# Patient Record
Sex: Male | Born: 1975 | Race: White | Hispanic: No | Marital: Married | Smoking: Current every day smoker
Health system: Southern US, Community
[De-identification: ages and names within clinical notes are randomized; demographics above are authoritative.]

---

## 1999-02-16 ENCOUNTER — Observation Stay (HOSPITAL_COMMUNITY): Admission: EM | Admit: 1999-02-16 | Discharge: 1999-02-17 | Payer: Self-pay | Admitting: Emergency Medicine

## 1999-02-16 ENCOUNTER — Encounter: Payer: Self-pay | Admitting: Emergency Medicine

## 2021-03-04 ENCOUNTER — Encounter (HOSPITAL_COMMUNITY): Payer: Self-pay

## 2021-03-04 ENCOUNTER — Inpatient Hospital Stay (HOSPITAL_COMMUNITY)
Admission: EM | Admit: 2021-03-04 | Discharge: 2021-03-08 | DRG: 312 | Disposition: A | Discharge status: Home / Self Care | Payer: Medicaid Other | Attending: Internal Medicine | Admitting: Internal Medicine

## 2021-03-04 DIAGNOSIS — R109 Unspecified abdominal pain: Secondary | ICD-10-CM | POA: Diagnosis present

## 2021-03-04 DIAGNOSIS — Z20822 Contact with and (suspected) exposure to covid-19: Secondary | ICD-10-CM | POA: Diagnosis present

## 2021-03-04 DIAGNOSIS — R059 Cough, unspecified: Secondary | ICD-10-CM | POA: Diagnosis present

## 2021-03-04 DIAGNOSIS — I10 Essential (primary) hypertension: Secondary | ICD-10-CM | POA: Diagnosis present

## 2021-03-04 DIAGNOSIS — R000 Tachycardia, unspecified: Secondary | ICD-10-CM | POA: Diagnosis present

## 2021-03-04 DIAGNOSIS — Z8249 Family history of ischemic heart disease and other diseases of the circulatory system: Secondary | ICD-10-CM

## 2021-03-04 DIAGNOSIS — R079 Chest pain, unspecified: Secondary | ICD-10-CM

## 2021-03-04 DIAGNOSIS — R0602 Shortness of breath: Secondary | ICD-10-CM | POA: Diagnosis present

## 2021-03-04 DIAGNOSIS — M542 Cervicalgia: Secondary | ICD-10-CM | POA: Diagnosis present

## 2021-03-04 DIAGNOSIS — R112 Nausea with vomiting, unspecified: Secondary | ICD-10-CM | POA: Diagnosis present

## 2021-03-04 DIAGNOSIS — F17210 Nicotine dependence, cigarettes, uncomplicated: Secondary | ICD-10-CM | POA: Diagnosis present

## 2021-03-04 DIAGNOSIS — R739 Hyperglycemia, unspecified: Secondary | ICD-10-CM | POA: Diagnosis present

## 2021-03-04 DIAGNOSIS — R0789 Other chest pain: Secondary | ICD-10-CM | POA: Diagnosis present

## 2021-03-04 DIAGNOSIS — R519 Headache, unspecified: Secondary | ICD-10-CM | POA: Diagnosis present

## 2021-03-04 DIAGNOSIS — F419 Anxiety disorder, unspecified: Secondary | ICD-10-CM | POA: Diagnosis present

## 2021-03-04 DIAGNOSIS — R55 Syncope and collapse: Principal | ICD-10-CM | POA: Diagnosis present

## 2021-03-04 LAB — URINALYSIS, ROUTINE W REFLEX MICROSCOPIC
Bilirubin Urine: NEGATIVE
Glucose, UA: NEGATIVE mg/dL
Hgb urine dipstick: NEGATIVE
Ketones, ur: 20 mg/dL — AB
Leukocytes,Ua: NEGATIVE
Nitrite: NEGATIVE
Protein, ur: NEGATIVE mg/dL
Specific Gravity, Urine: 1.027 (ref 1.005–1.030)
pH: 6 (ref 5.0–8.0)

## 2021-03-04 LAB — CBC
HCT: 47.9 % (ref 39.0–52.0)
Hemoglobin: 16.1 g/dL (ref 13.0–17.0)
MCH: 32 pg (ref 26.0–34.0)
MCHC: 33.6 g/dL (ref 30.0–36.0)
MCV: 95.2 fL (ref 80.0–100.0)
Platelets: 294 10*3/uL (ref 150–400)
RBC: 5.03 MIL/uL (ref 4.22–5.81)
RDW: 14.6 % (ref 11.5–15.5)
WBC: 6.9 10*3/uL (ref 4.0–10.5)
nRBC: 0 % (ref 0.0–0.2)

## 2021-03-04 LAB — BASIC METABOLIC PANEL
Anion gap: 10 (ref 5–15)
BUN: 13 mg/dL (ref 6–20)
CO2: 21 mmol/L — ABNORMAL LOW (ref 22–32)
Calcium: 8.6 mg/dL — ABNORMAL LOW (ref 8.9–10.3)
Chloride: 108 mmol/L (ref 98–111)
Creatinine, Ser: 0.8 mg/dL (ref 0.61–1.24)
GFR, Estimated: >60 mL/min (ref >60)
Glucose, Bld: 93 mg/dL (ref 70–99)
Potassium: 3.6 mmol/L (ref 3.5–5.1)
Sodium: 139 mmol/L (ref 135–145)

## 2021-03-04 LAB — LIPASE, BLOOD: Lipase: 35 U/L (ref 11–51)

## 2021-03-04 LAB — TROPONIN I (HIGH SENSITIVITY): Troponin I (High Sensitivity): 4 ng/L (ref <18)

## 2021-03-04 MED ORDER — ONDANSETRON 4 MG PO TBDP
4.0000 mg | ORAL_TABLET | Freq: Once | ORAL | Status: AC | PRN
  Administered 2021-03-04: 4 mg via ORAL
  Filled 2021-03-04: qty 1

## 2021-03-04 NOTE — ED Triage Notes (Signed)
EMS reports pt had an syncope episode  while mowing  the lawn, EMS states been passing out for the past two days and also complaining left  distended flank pain and left neck that has been going on for the past two days with decrease urinary output and had tow vomiting episodes today. EMS states initial hr tachycardia 130 and was given 300 cc of NS   20 Left hand    130/90 HR 110 96 RA  CBG 91

## 2021-03-05 ENCOUNTER — Emergency Department (HOSPITAL_COMMUNITY): Payer: Self-pay

## 2021-03-05 ENCOUNTER — Observation Stay (HOSPITAL_BASED_OUTPATIENT_CLINIC_OR_DEPARTMENT_OTHER): Payer: Medicaid Other

## 2021-03-05 DIAGNOSIS — R55 Syncope and collapse: Principal | ICD-10-CM

## 2021-03-05 DIAGNOSIS — F17210 Nicotine dependence, cigarettes, uncomplicated: Secondary | ICD-10-CM

## 2021-03-05 DIAGNOSIS — R079 Chest pain, unspecified: Secondary | ICD-10-CM | POA: Insufficient documentation

## 2021-03-05 DIAGNOSIS — R0789 Other chest pain: Secondary | ICD-10-CM

## 2021-03-05 LAB — LIPID PANEL
Cholesterol: 158 mg/dL (ref 0–200)
HDL: 65 mg/dL (ref >40)
LDL Cholesterol: 61 mg/dL (ref 0–99)
Total CHOL/HDL Ratio: 2.4 RATIO
Triglycerides: 161 mg/dL — ABNORMAL HIGH (ref <150)
VLDL: 32 mg/dL (ref 0–40)

## 2021-03-05 LAB — SARS CORONAVIRUS 2 (TAT 6-24 HRS): SARS Coronavirus 2: NEGATIVE

## 2021-03-05 LAB — TROPONIN I (HIGH SENSITIVITY): Troponin I (High Sensitivity): 3 ng/L (ref <18)

## 2021-03-05 LAB — HIV ANTIBODY (ROUTINE TESTING W REFLEX): HIV Screen 4th Generation wRfx: NONREACTIVE

## 2021-03-05 LAB — ECHOCARDIOGRAM COMPLETE
AR max vel: 3.8 cm2
AV Area VTI: 3.44 cm2
AV Area mean vel: 3.83 cm2
AV Mean grad: 2 mmHg
AV Peak grad: 4.2 mmHg
Ao pk vel: 1.03 m/s
Area-P 1/2: 3.93 cm2
Height: 68 in
MV VTI: 4.07 cm2
S' Lateral: 2.3 cm
Weight: 2880 oz

## 2021-03-05 LAB — HEMOGLOBIN A1C
Hgb A1c MFr Bld: 5.3 % (ref 4.8–5.6)
Mean Plasma Glucose: 105.41 mg/dL

## 2021-03-05 LAB — TSH: TSH: 1.918 u[IU]/mL (ref 0.350–4.500)

## 2021-03-05 MED ORDER — ASPIRIN 81 MG PO CHEW
81.0000 mg | CHEWABLE_TABLET | Freq: Every day | ORAL | Status: DC
  Administered 2021-03-05 – 2021-03-08 (×4): 81 mg via ORAL
  Filled 2021-03-05 (×4): qty 1

## 2021-03-05 MED ORDER — SODIUM CHLORIDE 0.9% FLUSH
3.0000 mL | Freq: Two times a day (BID) | INTRAVENOUS | Status: DC
  Administered 2021-03-05 – 2021-03-08 (×6): 3 mL via INTRAVENOUS

## 2021-03-05 MED ORDER — ACETAMINOPHEN 650 MG RE SUPP: 650.0000 mg | Freq: Four times a day (QID) | RECTAL | Status: DC | PRN

## 2021-03-05 MED ORDER — ACETAMINOPHEN 325 MG PO TABS
650.0000 mg | ORAL_TABLET | Freq: Four times a day (QID) | ORAL | Status: DC | PRN
  Administered 2021-03-06 – 2021-03-07 (×4): 650 mg via ORAL
  Filled 2021-03-05 (×5): qty 2

## 2021-03-05 MED ORDER — ENOXAPARIN SODIUM 40 MG/0.4ML ~~LOC~~ SOLN
40.0000 mg | SUBCUTANEOUS | Status: DC
  Administered 2021-03-05 – 2021-03-08 (×4): 40 mg via SUBCUTANEOUS
  Filled 2021-03-05 (×4): qty 0.4

## 2021-03-05 NOTE — Hospital Course (Signed)
Admitted 03/04/2021  Allergies: Patient has no known allergies. Pertinent Hx: no known medical conditions  45 y.o. male p/w chest pain, syncope  * Chest pain: Exertional, typical in nature, unclear if nitro helps (didn't receive), associated with n/v, sob, dizziness, and sweating. EKG unremarkable. Trops 4 >5. No CP at this time. Starting ASA. Placed on tele, checking A1c and lipid panel. Echo showed no valvular abnormalities.    *Syncope: One episode had no prodromal symptoms, one was preceded by lightheadedness, dizziness, and shortness of breath.  Echocardiogram showed no valvular abnormalities.  Placed on telemetry and obtaining orthostatics. May need outpatient cardiac monitor.  Consults: None Meds: ASA VTE ppx: Lovenox IVF: None diet: HH

## 2021-03-05 NOTE — ED Notes (Addendum)
Pt outside with family member told pt he need to be in waiting room

## 2021-03-05 NOTE — ED Notes (Signed)
ECHO in progress at this time 

## 2021-03-05 NOTE — ED Notes (Signed)
Pt called out stating his IV was coming out. Assessed the IV and site, IV remains patent and site redressed with new tegaderm and reinforced with tape. IV flushed and saline locked. Pt comfortable and monitoring devices replaced and functioning.

## 2021-03-05 NOTE — ED Notes (Signed)
Patient's wife to bedside. Provided with recliner chair. Patient and wife deny further needs at this time.

## 2021-03-05 NOTE — ED Notes (Signed)
Patient ambulatory to and from restroom with steady gait. Denies dizziness/other issues with ambulation. Patient provided with personal care items to cleanse and is performing self care independently.

## 2021-03-05 NOTE — ED Provider Notes (Signed)
[facility] EMERGENCY DEPARTMENT Provider Note   CSN: [id number] Arrival date & time: 03/04/21  2133     History Chief Complaint  Patient presents with  . Loss of Consciousness  . Flank Pain    Roger Peterson is a 45 y.o. male.  HPI Patient presents with syncopal episodes and chest pain.  Has had some these episodes for a little while but worse over the last few days.  States that he gets some pain in his left lower chest.  States he will cough at times.  Did have some nausea and vomiting.  States however if he tries to exert himself he gets pain in the bottom of his left arm and up his neck.  States his arm gets sweaty and is he will get a little sweaty all over.  Patient does smoke.  Denies drug use.  States his mother had heart arrhythmias that started at a younger age.  States he did have an episode of passing out where he was not exerting himself.  But if he would exert himself he states he would get the pain on the chest arm neck and likely feel dizzy.  States he was pushing mowing the lawn yesterday and passed out after it.  He has had his COVID vaccine.  Patient does not have a doctor that he sees.    History reviewed. No pertinent past medical history.  There are no problems to display for this patient.   History reviewed. No pertinent surgical history.     History reviewed. No pertinent family history.     Home Medications Prior to Admission medications   Not on File    Allergies    Patient has no known allergies.  Review of Systems   Review of Systems  Constitutional: Negative for appetite change.  HENT: Negative for congestion.   Respiratory: Negative for shortness of breath.   Cardiovascular: Positive for chest pain.  Gastrointestinal: Positive for nausea.  Genitourinary: Negative for flank pain.  Musculoskeletal: Negative for back pain.  Skin: Negative for rash.  Neurological: Positive for syncope.  Psychiatric/Behavioral:  Negative for confusion.    Physical Exam Updated Vital Signs BP 136/86 (BP Location: Right Arm)   Pulse 92   Temp 97.9 F (36.6 C)   Resp 17   SpO2 98%   Physical Exam Vitals and nursing note reviewed.  Constitutional:      Comments: Patient has multiple tattoos.  HENT:     Head: Atraumatic.     Right Ear: External ear normal.     Left Ear: External ear normal.  Eyes:     General: No scleral icterus.    Pupils: Pupils are equal, round, and reactive to light.  Cardiovascular:     Rate and Rhythm: Regular rhythm.  Pulmonary:     Breath sounds: No wheezing or rhonchi.  Abdominal:     Tenderness: There is no abdominal tenderness.  Musculoskeletal:        General: No tenderness.     Cervical back: Neck supple.  Skin:    General: Skin is warm.     Capillary Refill: Capillary refill takes less than 2 seconds.  Neurological:     Mental Status: He is alert and oriented to person, place, and time.  Psychiatric:        Mood and Affect: Mood normal.     ED Results / Procedures / Treatments   Labs (all labs ordered are listed, but only abnormal results are  displayed) Labs Reviewed  BASIC METABOLIC PANEL - Abnormal; Notable for the following components:      Result Value   CO2 21 (*)    Calcium 8.6 (*)    All other components within normal limits  URINALYSIS, ROUTINE W REFLEX MICROSCOPIC - Abnormal; Notable for the following components:   Ketones, ur 20 (*)    All other components within normal limits  SARS CORONAVIRUS 2 (TAT 6-24 HRS)  CBC  LIPASE, BLOOD  TROPONIN I (HIGH SENSITIVITY)  TROPONIN I (HIGH SENSITIVITY)    EKG EKG Interpretation  Date/Time:  Monday March 04 2021 21:43:07 EDT Ventricular Rate:  95 PR Interval:  160 QRS Duration: 98 QT Interval:  334 QTC Calculation: 419 R Axis:   71 Text Interpretation: Normal sinus rhythm Normal ECG No old tracing to compare Confirmed by Dione Booze ([id number]) on 03/05/2021 2:36:21 AM Also confirmed by Benjiman Core [phone])  on 03/05/2021 7:55:14 AM   Radiology No results found.  Procedures Procedures   Medications Ordered in ED Medications  ondansetron (ZOFRAN-ODT) disintegrating tablet 4 mg (4 mg Oral Given 03/04/21 2153)    ED Course  I have reviewed the triage vital signs and the nursing notes.  Pertinent labs & imaging results that were available during my care of the patient were reviewed by me and considered in my medical decision making (see chart for details).    MDM Rules/Calculators/A&P                          Patient presents with chest pain/arm pain/neck pain.  Exertional.  States the amount of exertion that brings on the pain is decreased.  EKG reassuring and troponin negative, however with exertion is also had syncope.  Patient does smoke potentially has a cardiac family history.  EKG reassuring.  Blood work reassuring but with worrisome story I feels the patient would benefit from mission to the hospital, particularly since he has no reliable follow-up at this point.  Will discuss with unassigned medicine. Final Clinical Impression(s) / ED Diagnoses Final diagnoses:  Chest pain, unspecified type  Syncope, unspecified syncope type    Rx / DC Orders ED Discharge Orders    None       Benjiman Core, MD 03/05/21 [phone]

## 2021-03-05 NOTE — ED Notes (Signed)
Called lab to have lipid panel added to previous collection

## 2021-03-05 NOTE — ED Notes (Signed)
Echo at bedside

## 2021-03-05 NOTE — H&P (Signed)
Date: 03/05/2021               Patient Name:  Roger Peterson MRN: [id number]  DOB: Dec 05, 1976 Age / Sex: 45 y.o., male   PCP: Pcp, No         Medical Service: Internal Medicine Teaching Service         Attending Physician: Dr. Mayford Knife, Dorene Ar, MD    First Contact: Dr. Austin Miles Pager: [phone]  Second Contact: Dr. Barbaraann Faster Pager: [phone]       After Hours (After 5p/  First Contact Pager: [phone]  weekends / holidays): Second Contact Pager: [phone]   Chief Complaint: Syncopal and exertional chest pain  History of Present Illness: This is a 45 year old male with no known past medical history who is presenting after episodes of syncope and exertional chest pain.  He reports that the pain is located on the left side of his chest, a pressure sensation, it is worse with exertion and radiates up to his left neck and left arm.  Has been going on for the past 2 to 3 weeks, occurring almost daily for the past 3 to 4 days.  Associated with nausea, nonbilious nonbloody emesis, shortness of breath, and sweating.  He has not taken any medications for this.  He denies any current chest pain, however does report a mild pressure sensation.   He also had 2 syncopal episodes.  About 4 days ago he was sitting on a bench in his bathroom when he had a sudden loss of consciousness, lasted for approximately 10 to 15 seconds according to the wife, when he woke up he was confused and did not understand what happened, he denied any prodromal symptoms prior to this episode.  He denied any tongue biting or incontinence. then yesterday he was mowing the lawn, started having sensation dizziness, cold sweats, and went and sat in his car, while he was sitting there his dizziness got worse and he blacked out at that time.  He never had this before.  He does state his mother had a history of arrhythmia and no as some type of blockage.  He also states his father has some type of cardiac issue, is unclear what type.  He  does not have a primary care physician.  In the ED patient was noted to be afebrile, initially tachycardic up to 106, mildly hypertensive at 130/95.  His chest x-ray was negative for any acute abnormalities.  His troponins were 4 and 5.  CBC was within normal limits.  Bicarb is mildly low at 21.  EKG showed normal sinus rhythm, heart rate 95, no acute ST changes.  Patient was admitted to IMTS for further evaluation and management.  Meds:  No outpatient medications have been marked as taking for the 03/04/21 encounter [facility] Encounter).     Allergies: Allergies as of 03/04/2021  . (No Known Allergies)   History reviewed. No pertinent past medical history.  Family History: Mother has a history of an arrhythmia.  Father has some type of cardiac issue.  Social History: He reports smoking approximately 1 pack/day.  Drinks about 1 beer twice a week, last drink was a few days ago.  Denies any recreational drug use.  Works in home improvement.  He has 3 boys.  Currently lives with his uncle and his cousin.  Review of Systems: A complete ROS was negative except as per HPI.   Physical Exam: Blood pressure 120/64, pulse 87, temperature 97.9 F (36.6 C), resp.  rate 13, height 5\' 8"  (1.727 m), weight 81.6 kg, SpO2 95 %. Physical Exam Constitutional:      Appearance: Normal appearance.  HENT:     Head: Normocephalic and atraumatic.     Right Ear: Tympanic membrane normal.     Left Ear: Tympanic membrane normal.     Nose: Nose normal. No congestion.     Mouth/Throat:     Mouth: Mucous membranes are moist.     Pharynx: Oropharynx is clear.  Eyes:     Extraocular Movements: Extraocular movements intact.     Conjunctiva/sclera: Conjunctivae normal.     Pupils: Pupils are equal, round, and reactive to light.  Cardiovascular:     Rate and Rhythm: Normal rate and regular rhythm.     Pulses: Normal pulses.     Heart sounds: Normal heart sounds.  Pulmonary:     Effort: Pulmonary effort is  normal. No respiratory distress.     Breath sounds: Normal breath sounds.  Abdominal:     General: Abdomen is flat. Bowel sounds are normal. There is no distension.     Palpations: Abdomen is soft.     Tenderness: There is no abdominal tenderness.  Musculoskeletal:        General: No swelling. Normal range of motion.     Cervical back: Normal range of motion and neck supple.  Skin:    General: Skin is warm and dry.     Capillary Refill: Capillary refill takes less than 2 seconds.  Neurological:     General: No focal deficit present.     Mental Status: He is alert and oriented to person, place, and time.  Psychiatric:        Mood and Affect: Mood normal.        Behavior: Behavior normal.      EKG: personally reviewed my interpretation is normal sinus rhythm, heart rate 95, left axis deviation, no acute ST changes, no prior to compare  CXR: personally reviewed my interpretation is moderate inspiration, no cardiomegaly, no infiltrates or pleural effusion noted  Assessment & Plan by Problem: Active Problems:   Syncope  Chest pain: Patient reporting episodic chest pain that is exertional in nature, improves with rest, unclear if improves with nitroglycerin.  Appears very typical in nature.  Associated with nausea, emesis, shortness of breath, sweating, and radiates to his left arm and jaw.  He does report smoking, unclear what his other risk factors are.  Does not follow with a PCP.  He was mildly hypertensive on admission.  His EKG shows normal sinus rhythm with no acute ST changes.  His troponins have been flat at 4 and 5.  His CBC and CMP relatively unremarkable.  Patient also reporting episodes of syncope noted below.  Will monitor overnight for recurrence of chest pain.  -Aspirin 81 mg daily -Place on telemetry -Obtaining echocardiogram for syncopal evaluation -Check A1c and lipid panel -If recurrence of chest pain will try nitro, and repeat EKG and troponins -?Cardiology  consult -[facility] consult for PCP needs  Syncope: Patient reporting at least 2 episodes of a syncopal event.  His first episode occurred while he was sitting down, not related to exertion, and had no prodromal symptoms, did have some confusion following the episode.  The second episode occurred after exertion and mowing his lawn, this was associated with cold sweats, dizziness, and lightheadedness. His cardiac and pulmonary exam are unremarkable, with no murmurs, rubs, or gallops noted.  Symptoms do not appear to be consistent  with seizure activity.  Differential includes cardiac arrhythmia, orthostatic hypotension, electrolyte abnormalities. Will place on telemetry and monitor overnight.   -Follow-up echocardiogram -Place on telemetry -Orthostatic vitals -May need cardiology consult for long term monitor -Repeat CBC and BMP in AM  Tobacco use: Smokes 1 pack/day.   -Encourage smoking cessation. -Nicotine patch  Dispo: Admit patient to Observation with expected length of stay less than 2 midnights.  Signed: Claudean Severance, MD 03/05/2021, 11:20 AM  Pager: [phone] After 5pm on weekdays and 1pm on weekends: On Call pager: [phone]

## 2021-03-06 ENCOUNTER — Other Ambulatory Visit: Payer: Self-pay

## 2021-03-06 ENCOUNTER — Encounter (HOSPITAL_COMMUNITY): Payer: Self-pay | Admitting: Internal Medicine

## 2021-03-06 LAB — COMPREHENSIVE METABOLIC PANEL
ALT: 19 U/L (ref 0–44)
AST: 19 U/L (ref 15–41)
Albumin: 3.3 g/dL — ABNORMAL LOW (ref 3.5–5.0)
Alkaline Phosphatase: 49 U/L (ref 38–126)
Anion gap: 7 (ref 5–15)
BUN: 12 mg/dL (ref 6–20)
CO2: 24 mmol/L (ref 22–32)
Calcium: 8.7 mg/dL — ABNORMAL LOW (ref 8.9–10.3)
Chloride: 105 mmol/L (ref 98–111)
Creatinine, Ser: 0.88 mg/dL (ref 0.61–1.24)
GFR, Estimated: >60 mL/min (ref >60)
Glucose, Bld: 116 mg/dL — ABNORMAL HIGH (ref 70–99)
Potassium: 3.7 mmol/L (ref 3.5–5.1)
Sodium: 136 mmol/L (ref 135–145)
Total Bilirubin: 0.9 mg/dL (ref 0.3–1.2)
Total Protein: 6.1 g/dL — ABNORMAL LOW (ref 6.5–8.1)

## 2021-03-06 LAB — CBC
HCT: 45.3 % (ref 39.0–52.0)
Hemoglobin: 15.8 g/dL (ref 13.0–17.0)
MCH: 32.6 pg (ref 26.0–34.0)
MCHC: 34.9 g/dL (ref 30.0–36.0)
MCV: 93.4 fL (ref 80.0–100.0)
Platelets: 257 10*3/uL (ref 150–400)
RBC: 4.85 MIL/uL (ref 4.22–5.81)
RDW: 14.6 % (ref 11.5–15.5)
WBC: 6.5 10*3/uL (ref 4.0–10.5)
nRBC: 0 % (ref 0.0–0.2)

## 2021-03-06 LAB — TROPONIN I (HIGH SENSITIVITY): Troponin I (High Sensitivity): <2 ng/L (ref <18)

## 2021-03-06 LAB — GLUCOSE, CAPILLARY: Glucose-Capillary: 104 mg/dL — ABNORMAL HIGH (ref 70–99)

## 2021-03-06 MED ORDER — NICOTINE 21 MG/24HR TD PT24: 21.0000 mg | MEDICATED_PATCH | Freq: Every day | TRANSDERMAL | Status: DC

## 2021-03-06 MED ORDER — NITROGLYCERIN 0.4 MG SL SUBL
0.4000 mg | SUBLINGUAL_TABLET | Freq: Once | SUBLINGUAL | Status: AC | PRN
  Administered 2021-03-06: 0.4 mg via SUBLINGUAL
  Filled 2021-03-06: qty 1

## 2021-03-06 MED ORDER — NITROGLYCERIN 0.4 MG SL SUBL: 0.4000 mg | SUBLINGUAL_TABLET | SUBLINGUAL | Status: DC | PRN

## 2021-03-06 MED ORDER — LIDOCAINE 5 % EX PTCH
1.0000 | MEDICATED_PATCH | Freq: Every day | CUTANEOUS | Status: DC | PRN
  Administered 2021-03-06: 1 via TRANSDERMAL
  Filled 2021-03-06 (×2): qty 1

## 2021-03-06 MED ORDER — NICOTINE 21 MG/24HR TD PT24
21.0000 mg | MEDICATED_PATCH | Freq: Every day | TRANSDERMAL | Status: DC
  Administered 2021-03-06 – 2021-03-08 (×3): 21 mg via TRANSDERMAL
  Filled 2021-03-06 (×3): qty 1

## 2021-03-06 NOTE — ED Notes (Signed)
Attempted report x1. 

## 2021-03-06 NOTE — Progress Notes (Signed)
OT Cancellation Note  Patient Details Name: Roger Peterson MRN: [id number] DOB: 10/30/1976   Cancelled Treatment:    Reason Eval/Treat Not Completed: Discussed with PT. OT screened, no needs identified, will sign off  [facility]  Luisa Dago, OT/L   Acute OT Clinical Specialist Acute Rehabilitation Services Pager [phone] Office [phone]  03/06/2021, 10:08 AM

## 2021-03-06 NOTE — Plan of Care (Signed)

## 2021-03-06 NOTE — Progress Notes (Addendum)
   Subjective:   Had an episode of nausea last night for which he received zofran, now resolved. No further chest pain in the past day.  He does complain of neck pain. Upon further questioning, this is a new symptoms since Sunday which began suddenly. It has improved since then but not resolved. This preceded his recent episode of syncope. It is throbbing in nature, associated with photophobia, nausea, and dizziness. He locates it to his L posterior neck and occipital region. No history of similar headaches, but does note a musculoskeletal injury in the past and states this feels like whiplash. Patient and his wife agree that he has been more forgetful lately and seem to think more sluggishly. He answers questions appropriately but takes a mildly longer than what would be expected. Able to count down from 20 by 2s with some difficulty, unable to count down from 100 by 7s at all. Pain is worse with forward flexion and mildly worse with rotation especially to the left, not bothered by flexion to the left or right.   Objective:  Vital signs in last 24 hours: Vitals:   03/06/21 1230 03/06/21 1840 03/06/21 2341 03/07/21 0515  BP: 121/77 131/86 135/80 129/82  Pulse: 84 81 84 86  Resp: 16 16 18 16   Temp: 98.4 F (36.9 C) 98.4 F (36.9 C) 97.7 F (36.5 C) 97.9 F (36.6 C)  TempSrc: Oral Oral Oral Oral  SpO2: 96% 97% 95% 98%  Weight:    85.5 kg  Height:       Physical Exam Vitals and nursing note reviewed.  Constitutional: no acute distress Head: atraumatic, minimal tenderness to posterior low occipital/high cervical region,  ENT: external ears normal Eyes: EOMI, PERRL Pulmonary: effort normal Abdominal: flat Musculoskeletal: no muscle spasm over L or R paracervical area, no trapezius tenderness Skin: warm and dry Neurological: alert, no focal deficit Psychiatric: normal mood and affect   Assessment/Plan: Mr. Fieldhouse is a 45 y/o M with no known medical history presenting with chest  pain and syncope, negative cardiac workup thus far.   Active Problems:   Syncope   Syncope with headache Prodromal with chest pain and also headache during 1 of 2 syncopal episodes, but also has chest pain without syncope. Normal orthostatics upon admission, telemetry unrevealing thus far, echo without valvular or structural abnormalities. With sudden onset severe headache that has improved but not resolved since then, concern for intracranial bleed or other neurologic etiology. --cardiology following. Stress test as above. Possibly event monitor on outpatient follow up --f/u non-contrast head CT  Chest Pain Cardiac workup thus far has been negative, low suspicion for ACS. EKG unremarkable, troponins flat, ECHO negative for valvular/structural abnormalities. HbA1c, TSH, lipid panel all within normal limits. --Cardiology consulted, appreciate recommendations. plan for nuclear stress this morning.  -continue ASA 81mg  daily -TOC consult for PCP needs -- scheduled for visit at [facility] on May 5th -encourage smoking cessation   Prior to Admission Living Arrangement: Home Anticipated Discharge Location: TBD Barriers to Discharge: continued medical management Dispo: Anticipated discharge in approximately 0-1 day(s).   [facility], MD 03/07/2021, 6:39 AM Pager: [phone] After 5pm on weekdays and 1pm on weekends: On Call pager [phone]

## 2021-03-06 NOTE — Consult Note (Signed)
Reason for Consult: Recurrent chest pain Referring Physician: [facility]  Roger EaringRandy D Aime is an 45 y.o. male.  HPI: Patient is 45 year old male with no significant past medical history except for tobacco abuse was admitted yesterday because of recurrent left-sided chest pain described as pressure radiating to left arm lasting few minutes associated with syncopal episode.  Chest pain is nonexertional and also increases with deep breathing.  States breath exercises regularly without any chest pain.  Denies any weakness in the arms or legs denies any slurred speech denies any seizure activity.  EKG done in the ED showed no acute ischemic changes 2 sets of high-sensitivity troponin high has been negative.  Patient denies any shortness of breath.  Denies palpitation lightheadedness prior to syncopal episode.  Patient denies any drug abuse.  History reviewed. No pertinent past medical history.  History reviewed. No pertinent surgical history.  History reviewed. No pertinent family history.  Social History:  reports that he has been smoking cigarettes. He has quit using smokeless tobacco. No history on file for alcohol use and drug use.  Allergies: No Known Allergies  Medications: I have reviewed the patient's current medications.  Results for orders placed or performed during the hospital encounter of 03/04/21 (from the past 48 hour(s))  Urinalysis, Routine w reflex microscopic Urine, Clean Catch     Status: Abnormal   Collection Time: 03/04/21  9:48 PM  Result Value Ref Range   Color, Urine YELLOW YELLOW   APPearance CLEAR CLEAR   Specific Gravity, Urine 1.027 1.005 - 1.030   pH 6.0 5.0 - 8.0   Glucose, UA NEGATIVE NEGATIVE mg/dL   Hgb urine dipstick NEGATIVE NEGATIVE   Bilirubin Urine NEGATIVE NEGATIVE   Ketones, ur 20 (A) NEGATIVE mg/dL   Protein, ur NEGATIVE NEGATIVE mg/dL   Nitrite NEGATIVE NEGATIVE   Leukocytes,Ua NEGATIVE NEGATIVE    Comment: Performed at [facility], [address] [place], [place] [address]  Basic metabolic panel     Status: Abnormal   Collection Time: 03/04/21  9:55 PM  Result Value Ref Range   Sodium 139 135 - 145 mmol/L   Potassium 3.6 3.5 - 5.1 mmol/L   Chloride 108 98 - 111 mmol/L   CO2 21 (L) 22 - 32 mmol/L   Glucose, Bld 93 70 - 99 mg/dL    Comment: Glucose reference range applies only to samples taken after fasting for at least 8 hours.   BUN 13 6 - 20 mg/dL   Creatinine, Ser 6.040.80 0.61 - 1.24 mg/dL   Calcium 8.6 (L) 8.9 - 10.3 mg/dL   GFR, Estimated >54>60 >09>60 mL/min    Comment: (NOTE) Calculated using the CKD-EPI Creatinine Equation (2021)    Anion gap 10 5 - 15    Comment: Performed at [facility], [address] [place], [place] [address]  CBC     Status: None   Collection Time: 03/04/21  9:55 PM  Result Value Ref Range   WBC 6.9 4.0 - 10.5 K/uL   RBC 5.03 4.22 - 5.81 MIL/uL   Hemoglobin 16.1 13.0 - 17.0 g/dL   HCT 47.847.9 29.539.0 - 62.152.0 %   MCV 95.2 80.0 - 100.0 fL   MCH 32.0 26.0 - 34.0 pg   MCHC 33.6 30.0 - 36.0 g/dL   RDW 30.814.6 65.711.5 - 84.615.5 %   Platelets 294 150 - 400 K/uL   nRBC 0.0 0.0 - 0.2 %    Comment: Performed at [facility], [address], [place], [place] [address]  Lipase, blood     Status: None   Collection Time: 03/04/21  9:55 PM  Result Value Ref Range   Lipase 35 11 - 51 U/L    Comment: Performed at [facility], [address] [place], [place] [address]  Troponin I (High Sensitivity)     Status: None   Collection Time: 03/04/21  9:55 PM  Result Value Ref Range   Troponin I (High Sensitivity) 4 <18 ng/L    Comment: (NOTE) Elevated high sensitivity troponin I (hsTnI) values and significant  changes across serial measurements may suggest ACS but many other  chronic and acute conditions are known to elevate hsTnI results.  Refer to the "Links" section for chest pain algorithms and additional  guidance. Performed at [facility], [address]  [place], [place] [address]   Troponin I (High Sensitivity)     Status: None   Collection Time: 03/05/21 12:08 AM  Result Value Ref Range   Troponin I (High Sensitivity) 3 <18 ng/L    Comment: (NOTE) Elevated high sensitivity troponin I (hsTnI) values and significant  changes across serial measurements may suggest ACS but many other  chronic and acute conditions are known to elevate hsTnI results.  Refer to the "Links" section for chest pain algorithms and additional  guidance. Performed at [facility], [address] [place], [place] [address]   Lipid panel     Status: Abnormal   Collection Time: 03/05/21 12:08 AM  Result Value Ref Range   Cholesterol 158 0 - 200 mg/dL   Triglycerides 253 (H) <150 mg/dL   HDL 65 >66 mg/dL   Total CHOL/HDL Ratio 2.4 RATIO   VLDL 32 0 - 40 mg/dL   LDL Cholesterol 61 0 - 99 mg/dL    Comment:        Total Cholesterol/HDL:CHD Risk Coronary Heart Disease Risk Table                     Men   Women  1/2 Average Risk   3.4   3.3  Average Risk       5.0   4.4  2 X Average Risk   9.6   7.1  3 X Average Risk  23.4   11.0        Use the calculated Patient Ratio above and the CHD Risk Table to determine the patient's CHD Risk.        ATP III CLASSIFICATION (LDL):  <100     mg/dL   Optimal  440-347  mg/dL   Near or Above                    Optimal  130-159  mg/dL   Borderline  425-956  mg/dL   High  >387     mg/dL   Very High Performed at [facility], [address], [place], [place] [address]   SARS CORONAVIRUS 2 (TAT 6-24 HRS) Nasopharyngeal Nasopharyngeal Swab     Status: None   Collection Time: 03/05/21  7:48 AM   Specimen: Nasopharyngeal Swab  Result Value Ref Range   SARS Coronavirus 2 NEGATIVE NEGATIVE    Comment: (NOTE) SARS-CoV-2 target nucleic acids are NOT DETECTED.  The SARS-CoV-2 RNA is generally detectable in upper and lower respiratory specimens during the acute phase of infection. Negative results do not preclude  SARS-CoV-2 infection, do not rule  out co-infections with other pathogens, and should not be used as the sole basis for treatment or other patient management decisions. Negative results must be combined with clinical observations, patient history, and epidemiological information. The expected result is Negative.  Fact Sheet for Patients: HairSlick.no  Fact Sheet for Healthcare Providers: quierodirigir.com  This test is not yet approved or cleared by the [place] FDA and  has been authorized for detection and/or diagnosis of SARS-CoV-2 by FDA under an Emergency Use Authorization (EUA). This EUA will remain  in effect (meaning this test can be used) for the duration of the COVID-19 declaration under Se ction 564(b)(1) of the Act, 21 U.S.C. section 360bbb-3(b)(1), unless the authorization is terminated or revoked sooner.  Performed at [facility], [address], [place], [place] [address]   HIV Antibody (routine testing w rflx)     Status: None   Collection Time: 03/05/21  9:45 AM  Result Value Ref Range   HIV Screen 4th Generation wRfx Non Reactive Non Reactive    Comment: Performed at [facility], [address], [place], [place] [address]  TSH     Status: None   Collection Time: 03/05/21  9:45 AM  Result Value Ref Range   TSH 1.918 0.350 - 4.500 uIU/mL    Comment: Performed by a 3rd Generation assay with a functional sensitivity of <=0.01 uIU/mL. Performed at [facility], [address] [place], [place] [address]   Hemoglobin A1c     Status: None   Collection Time: 03/05/21 12:30 PM  Result Value Ref Range   Hgb A1c MFr Bld 5.3 4.8 - 5.6 %    Comment: (NOTE) Pre diabetes:          5.7%-6.4%  Diabetes:              >6.4%  Glycemic control for   <7.0% adults with diabetes    Mean Plasma Glucose 105.41 mg/dL    Comment: Performed at [facility], [address] [place], [place]  [address]  Comprehensive metabolic panel     Status: Abnormal   Collection Time: 03/06/21  3:04 AM  Result Value Ref Range   Sodium 136 135 - 145 mmol/L   Potassium 3.7 3.5 - 5.1 mmol/L   Chloride 105 98 - 111 mmol/L   CO2 24 22 - 32 mmol/L   Glucose, Bld 116 (H) 70 - 99 mg/dL    Comment: Glucose reference range applies only to samples taken after fasting for at least 8 hours.   BUN 12 6 - 20 mg/dL   Creatinine, Ser 0.72 0.61 - 1.24 mg/dL   Calcium 8.7 (L) 8.9 - 10.3 mg/dL   Total Protein 6.1 (L) 6.5 - 8.1 g/dL   Albumin 3.3 (L) 3.5 - 5.0 g/dL   AST 19 15 - 41 U/L   ALT 19 0 - 44 U/L   Alkaline Phosphatase 49 38 - 126 U/L   Total Bilirubin 0.9 0.3 - 1.2 mg/dL   GFR, Estimated >25 >75 mL/min    Comment: (NOTE) Calculated using the CKD-EPI Creatinine Equation (2021)    Anion gap 7 5 - 15    Comment: Performed at [facility], [address] [place], [place] [address]  CBC     Status: None   Collection Time: 03/06/21  3:04 AM  Result Value Ref Range   WBC 6.5 4.0 - 10.5 K/uL   RBC 4.85 4.22 - 5.81 MIL/uL  Hemoglobin 15.8 13.0 - 17.0 g/dL   HCT 82.9 56.2 - 13.0 %   MCV 93.4 80.0 - 100.0 fL   MCH 32.6 26.0 - 34.0 pg   MCHC 34.9 30.0 - 36.0 g/dL   RDW 86.5 78.4 - 69.6 %   Platelets 257 150 - 400 K/uL   nRBC 0.0 0.0 - 0.2 %    Comment: Performed at [facility], [address], [place], [place] [address]  Troponin I (High Sensitivity)     Status: None   Collection Time: 03/06/21  3:04 AM  Result Value Ref Range   Troponin I (High Sensitivity) <2 <18 ng/L    Comment: (NOTE) Elevated high sensitivity troponin I (hsTnI) values and significant  changes across serial measurements may suggest ACS but many other  chronic and acute conditions are known to elevate hsTnI results.  Refer to the "Links" section for chest pain algorithms and additional  guidance. Performed at [facility], [address] [place], [place] [address]   Glucose, capillary     Status:  Abnormal   Collection Time: 03/06/21  6:06 AM  Result Value Ref Range   Glucose-Capillary 104 (H) 70 - 99 mg/dL    Comment: Glucose reference range applies only to samples taken after fasting for at least 8 hours.    DG Chest Portable 1 View  Result Date: 03/05/2021 CLINICAL DATA:  Chest pain EXAM: PORTABLE CHEST 1 VIEW COMPARISON:  None. FINDINGS: The heart size and mediastinal contours are within normal limits. Both lungs are clear. The visualized skeletal structures are unremarkable. IMPRESSION: No active disease. Electronically Signed   By: Alcide Clever M.D.   On: 03/05/2021 08:05   ECHOCARDIOGRAM COMPLETE  Result Date: 03/05/2021    ECHOCARDIOGRAM REPORT   Patient Name:   Roger Peterson Date of Exam: 03/05/2021 Medical Rec #:  [id number]        Height:       68.0 in Accession #:    [id number]       Weight:       180.0 lb Date of Birth:  1976-01-21        BSA:          1.954 m Patient Age:    44 years         BP:           120/64 mmHg Patient Gender: M                HR:           85 bpm. Exam Location:  Inpatient Procedure: 2D Echo, 3D Echo, Cardiac Doppler, Color Doppler and Strain Analysis Indications:    Syncope  History:        Patient has prior history of Echocardiogram examinations and                 Patient has no prior history of Echocardiogram examinations.                 Signs/Symptoms:Syncope.  Sonographer:    Neomia Dear RDCS Referring Phys: [id number] NATHAN PICKERING IMPRESSIONS  1. Left ventricular ejection fraction, by estimation, is 55 to 60%. The left ventricle has normal function. The left ventricle has no regional wall motion abnormalities. Left ventricular diastolic parameters were normal.  2. Right ventricular systolic function is normal. The right ventricular size is normal. There is normal pulmonary artery systolic pressure. The estimated right ventricular systolic pressure is 15.4 mmHg.  3.  The mitral valve is grossly normal. No evidence of mitral valve regurgitation. No  evidence of mitral stenosis.  4. The aortic valve is tricuspid. Aortic valve regurgitation is not visualized. No aortic stenosis is present.  5. The inferior vena cava is normal in size with greater than 50% respiratory variability, suggesting right atrial pressure of 3 mmHg. Conclusion(s)/Recommendation(s): Normal biventricular function without evidence of hemodynamically significant valvular heart disease. FINDINGS  Left Ventricle: Left ventricular ejection fraction, by estimation, is 55 to 60%. The left ventricle has normal function. The left ventricle has no regional wall motion abnormalities. Global longitudinal strain performed but not reported based on interpreter judgement due to suboptimal tracking. The left ventricular internal cavity size was normal in size. There is no left ventricular hypertrophy. Left ventricular diastolic parameters were normal. Right Ventricle: The right ventricular size is normal. No increase in right ventricular wall thickness. Right ventricular systolic function is normal. There is normal pulmonary artery systolic pressure. The tricuspid regurgitant velocity is 1.76 m/s, and  with an assumed right atrial pressure of 3 mmHg, the estimated right ventricular systolic pressure is 15.4 mmHg. Left Atrium: Left atrial size was normal in size. Right Atrium: Right atrial size was normal in size. Pericardium: Trivial pericardial effusion is present. Mitral Valve: The mitral valve is grossly normal. No evidence of mitral valve regurgitation. No evidence of mitral valve stenosis. MV peak gradient, 2.0 mmHg. The mean mitral valve gradient is 1.0 mmHg. Tricuspid Valve: The tricuspid valve is grossly normal. Tricuspid valve regurgitation is not demonstrated. No evidence of tricuspid stenosis. Aortic Valve: The aortic valve is tricuspid. Aortic valve regurgitation is not visualized. No aortic stenosis is present. Aortic valve mean gradient measures 2.0 mmHg. Aortic valve peak gradient measures  4.2 mmHg. Aortic valve area, by VTI measures 3.44 cm. Pulmonic Valve: The pulmonic valve was grossly normal. Pulmonic valve regurgitation is not visualized. No evidence of pulmonic stenosis. Aorta: The aortic root and ascending aorta are structurally normal, with no evidence of dilitation. Venous: The inferior vena cava is normal in size with greater than 50% respiratory variability, suggesting right atrial pressure of 3 mmHg. IAS/Shunts: The atrial septum is grossly normal.  LEFT VENTRICLE PLAX 2D LVIDd:         4.20 cm  Diastology LVIDs:         2.30 cm  LV e' medial:    9.25 cm/s LV PW:         1.20 cm  LV E/e' medial:  8.0 LV IVS:        1.30 cm  LV e' lateral:   12.90 cm/s LVOT diam:     2.50 cm  LV E/e' lateral: 5.7 LV SV:         74 LV SV Index:   38 LVOT Area:     4.91 cm  RIGHT VENTRICLE RV S prime:     11.20 cm/s  PULMONARY VEINS TAPSE (M-mode): 2.2 cm      A Reversal Duration: 71.00 msec                             Diastolic Velocity:  29.20 cm/s                             S/D Velocity:        1.30  Systolic Velocity:   37.30 cm/s LEFT ATRIUM           Index       RIGHT ATRIUM           Index LA diam:      3.10 cm 1.59 cm/m  RA Area:     12.40 cm LA Vol (A4C): 40.0 ml 20.47 ml/m RA Volume:   27.80 ml  14.23 ml/m  AORTIC VALVE                   PULMONIC VALVE AV Area (Vmax):    3.80 cm    PV Vmax:       0.91 m/s AV Area (Vmean):   3.83 cm    PV Vmean:      64.600 cm/s AV Area (VTI):     3.44 cm    PV VTI:        0.174 m AV Vmax:           103.00 cm/s PV Peak grad:  3.3 mmHg AV Vmean:          75.700 cm/s PV Mean grad:  2.0 mmHg AV VTI:            0.214 m AV Peak Grad:      4.2 mmHg AV Mean Grad:      2.0 mmHg LVOT Vmax:         79.80 cm/s LVOT Vmean:        59.100 cm/s LVOT VTI:          0.150 m LVOT/AV VTI ratio: 0.70  AORTA Ao Root diam: 3.50 cm Ao Asc diam:  2.90 cm MITRAL VALVE               TRICUSPID VALVE MV Area (PHT): 3.93 cm    TR Peak grad:   12.4 mmHg MV  Area VTI:   4.07 cm    TR Vmax:        176.00 cm/s MV Peak grad:  2.0 mmHg MV Mean grad:  1.0 mmHg    SHUNTS MV Vmax:       0.71 m/s    Systemic VTI:  0.15 m MV Vmean:      52.0 cm/s   Systemic Diam: 2.50 cm MV Decel Time: 193 msec MV E velocity: 73.70 cm/s MV A velocity: 62.60 cm/s MV E/A ratio:  1.18 Lennie Odor MD Electronically signed by Lennie Odor MD Signature Date/Time: 03/05/2021/11:37:42 AM    Final     Review of Systems  Constitutional: Negative for diaphoresis, fatigue and fever.  HENT: Negative for sore throat.   Eyes: Negative for discharge.  Respiratory: Negative for shortness of breath and wheezing.   Cardiovascular: Positive for chest pain. Negative for palpitations and leg swelling.  Gastrointestinal: Positive for abdominal distention. Negative for abdominal pain.  Genitourinary: Negative for difficulty urinating.  Neurological: Negative for dizziness.   Blood pressure 125/84, pulse 89, temperature 98.3 F (36.8 C), temperature source Oral, resp. rate 19, height  (1.727 m), weight 85.5 kg, SpO2 96 %. Physical Exam Constitutional:      Appearance: Normal appearance.  HENT:     Head: Normocephalic and atraumatic.  Eyes:     Extraocular Movements: Extraocular movements intact.     Pupils: Pupils are equal, round, and reactive to light.  Neck:     Vascular: No carotid bruit.  Cardiovascular:     Rate and Rhythm: Normal rate and regular rhythm.  Heart sounds: No murmur heard. No gallop.   Pulmonary:     Effort: Pulmonary effort is normal.     Breath sounds: Normal breath sounds.  Abdominal:     General: Abdomen is flat. There is no distension.     Palpations: Abdomen is soft.     Tenderness: There is no abdominal tenderness.  Musculoskeletal:        General: No swelling or tenderness.     Cervical back: Normal range of motion and neck supple.     Right lower leg: No edema.     Left lower leg: No edema.  Skin:    General: Skin is warm and dry.   Neurological:     General: No focal deficit present.     Mental Status: He is alert and oriented to person, place, and time.     Assessment/Plan: Recurrent chest pain MI ruled out with some features worrisome for angina Status post syncope rule out cardiac arrhythmia Tobacco abuse Elevated blood sugar Plan Continue present management We will schedule him for nuclear stress test in a.m. May need event monitor as outpatient continues to have recurrent syncopal episode Check lipid panel Encourage patient to stop smoking and lifestyle changes and compliance with diet and restricting carbohydrates  Rinaldo Cloud 03/06/2021, 12:10 PM

## 2021-03-06 NOTE — TOC Initial Note (Addendum)
Transition of Care [facility]) - Initial/Assessment Note    Patient Details  Name: Roger Peterson MRN: [id number] Date of Birth: Jul 27, 1976  [facility] CM/SW Contact:    Kingsley Plan, RN Phone Number: 03/06/2021, 1:17 PM  Clinical Narrative:                  Spoke to patient at bedside. Confirmed face sheet information.  Patient does not have a PCP. Discussed [facility]. He prefers [facility], scheduled appointment for Apr 18, 2021 at 1010 placed on AVS     .Expected Discharge Plan: Home/Self Care Barriers to Discharge: Continued Medical Work up   Patient Goals and CMS Choice Patient states their goals for this hospitalization and ongoing recovery are:: to return to home CMS Medicare.gov Compare Post Acute Care list provided to:: Patient Choice offered to / list presented to : Patient  Expected Discharge Plan and Services Expected Discharge Plan: Home/Self Care   Discharge Planning Services: CM Consult,[facility] Program,Medication Assistance   Living arrangements for the past 2 months: Single Family Home                 DME Arranged: N/A DME Agency: NA       HH Arranged: NA          Prior Living Arrangements/Services Living arrangements for the past 2 months: Single Family Home Lives with:: Spouse Patient language and need for interpreter reviewed:: Yes Do you feel safe going back to the place where you live?: Yes      Need for Family Participation in Patient Care: Yes (Comment) Care giver support system in place?: Yes (comment)   Criminal Activity/Legal Involvement Pertinent to Current Situation/Hospitalization: No - Comment as needed  Activities of Daily Living Home Assistive Devices/Equipment: None ADL Screening (condition at time of admission) Patient's cognitive ability adequate to safely complete daily activities?: Yes Is the patient deaf or have difficulty hearing?: No Does the patient have  difficulty seeing, even when wearing glasses/contacts?: No Does the patient have difficulty concentrating, remembering, or making decisions?: Yes Patient able to express need for assistance with ADLs?: Yes Does the patient have difficulty dressing or bathing?: No Independently performs ADLs?: Yes (appropriate for developmental age) Does the patient have difficulty walking or climbing stairs?: No Weakness of Legs: None Weakness of Arms/Hands: Left  Permission Sought/Granted   Permission granted to share information with : No              Emotional Assessment Appearance:: Appears stated age Attitude/Demeanor/Rapport: Engaged Affect (typically observed): Accepting Orientation: : Oriented to Self,Oriented to Place,Oriented to  Time,Oriented to Situation Alcohol / Substance Use: Not Applicable Psych Involvement: No (comment)  Admission diagnosis:  Syncope [R55] Syncope, unspecified syncope type [R55] Chest pain, unspecified type [R07.9] Patient Active Problem List   Diagnosis Date Noted  . Syncope 03/05/2021  . Chest pain    PCP:  Pcp, No Pharmacy:   [id number] [place], [place] - [address] AT [facility] OF [address] & [facility] [address] [place] [address] Phone: [phone] Fax: [phone]  [facility] [place], [place] - [address] [place] [address] Phone: [phone] Fax: [phone]     Social Determinants of Health (SDOH) Interventions    Readmission Risk Interventions No flowsheet data found.

## 2021-03-06 NOTE — Evaluation (Signed)
Physical Therapy Evaluation Patient Details Name: Roger Peterson MRN: [id number] DOB: August 23, 1976 Today's Date: 03/06/2021   History of Present Illness  Roger Peterson is a 45 y/o male who reported to ED following L neck pain and a syncopal episode after mowing the lawn. Pt reports multiple episodes of syncope and was admitted 03/04/21.    Clinical Impression  Pt received in roo,m walking from restroom. Denied any chest pain this morning, but did report some abdominal pain. Pt moving well with no assist needed for mobility. No LOB. No increase in chest pain with mobility. VSS on RA. Pt's mobility is at his baseline. Pt left sitting at EOB with all needs met, call bell within reach, and family member present. At this time, do not anticipate that pt needs further PT follow up. Will sign off for now. Thank you for the chance to participate in this pt's care.     Follow Up Recommendations No PT follow up    Equipment Recommendations  None recommended by PT    Recommendations for Other Services       Precautions / Restrictions Precautions Precautions: None Restrictions Weight Bearing Restrictions: No      Mobility  Bed Mobility Overal bed mobility: Independent                  Transfers Overall transfer level: Independent                  Ambulation/Gait Ambulation/Gait assistance: Min guard Gait Distance (Feet): 200 Feet Assistive device: None Gait Pattern/deviations: WFL(Within Functional Limits);Step-to pattern     General Gait Details: min guard for safety, no physical assist given  Stairs Stairs: Yes Stairs assistance: Min guard Stair Management: One rail Left;Alternating pattern;Forwards Number of Stairs: 6 General stair comments: min guard for safety, reported minimal lightheadedness following stairs but decreased afterwards, VSS  Wheelchair Mobility    Modified Rankin (Stroke Patients Only)       Balance Overall balance assessment: Independent                                            Pertinent Vitals/Pain Pain Assessment: 0-10 Pain Score: 6  Pain Location: Abdominal pain Pain Descriptors / Indicators: Shooting;Sharp Pain Intervention(s): Monitored during session    Home Living Family/patient expects to be discharged to:: Private residence Living Arrangements: Other relatives (uncle and cousin) Available Help at Discharge: Family;Available PRN/intermittently Type of Home: House Home Access: Stairs to enter Entrance Stairs-Rails: Left Entrance Stairs-Number of Steps: 6 Home Layout: One level Home Equipment: Cane - single point      Prior Function Level of Independence: Independent               Hand Dominance        Extremity/Trunk Assessment   Upper Extremity Assessment Upper Extremity Assessment: Overall WFL for tasks assessed    Lower Extremity Assessment Lower Extremity Assessment: Overall WFL for tasks assessed    Cervical / Trunk Assessment Cervical / Trunk Assessment: Normal  Communication   Communication: No difficulties  Cognition Arousal/Alertness: Awake/alert Behavior During Therapy: WFL for tasks assessed/performed Overall Cognitive Status: Within Functional Limits for tasks assessed  General Comments General comments (skin integrity, edema, etc.): VSS on RA    Exercises     Assessment/Plan    PT Assessment Patent does not need any further PT services  PT Problem List         PT Treatment Interventions      PT Goals (Current goals can be found in the Care Plan section)  Acute Rehab PT Goals Patient Stated Goal: feel better PT Goal Formulation: With patient Time For Goal Achievement: 03/19/21 Potential to Achieve Goals: Good    Frequency     Barriers to discharge        Co-evaluation               AM-PAC PT "6 Clicks" Mobility  Outcome Measure Help needed turning from your back to your  side while in a flat bed without using bedrails?: None Help needed moving from lying on your back to sitting on the side of a flat bed without using bedrails?: None Help needed moving to and from a bed to a chair (including a wheelchair)?: None Help needed standing up from a chair using your arms (e.g., wheelchair or bedside chair)?: None Help needed to walk in hospital room?: None Help needed climbing 3-5 steps with a railing? : None 6 Click Score: 24    End of Session Equipment Utilized During Treatment: Gait belt Activity Tolerance: Patient tolerated treatment well Patient left: in bed;with call bell/phone within reach;with family/visitor present Nurse Communication: Mobility status PT Visit Diagnosis: History of falling (Z91.81)    Time:  -      Charges:            Rosita Kea, SPT

## 2021-03-06 NOTE — Progress Notes (Signed)
Paged by RN for chest pain while patient transferring to the floor. Patient states he is having left sided chest pain/pressure radiating down left arm. States he has a strange pressure like sensation in left arm. States feels similar to prior episodes of pain that brought him to the ED. States it started about half an hour ago in the ED when he was woken up to be transferred. States he become somewhat upset that wife was unable to accompany him to the floor and subsequently started having pain. Denies shortness of breath, diaphoresis, nausea, vomiting, dizziness, lightheadedness, syncope, or near syncope.   Blood pressure (!) 126/97, pulse 82, temperature 98 F (36.7 C), temperature source Oral, resp. rate 18, height 5\' 8"  (1.727 m), weight 86.5 kg, SpO2 95 %.  Consitutional: laying in bed, appears comfortable, no acute distress Pulmonary: Normal effort, normal breath sounds Cardiovascular: Normal rate and regular rhythm,  Skin: warm dry Neurological: alert and oriented x4, no weakness, no cranial nerve deficits, subjective diminished sensation in the left arm  Plan Appears to have similar episode of chest pain although appears to be lasting longer than usual. Possible that his chest pain has been exacerbated by anxiety, although he states he is feeling calmer now. Will repeat EKG and troponin to rule out ACS. Will order a dose of nitro to see if this improves his pain.

## 2021-03-06 NOTE — Progress Notes (Signed)
Pt arrived to room 5C21 in NAD,VS stable. Pt reports 7 out of 10 left sided CP that radiates and causes numbness in left arm. Pt states this pain is similar to the pain he came in with. Patient oriented to room and call bell in reach. Patient advised of visitation policy. Provider paged. See orders.

## 2021-03-06 NOTE — Progress Notes (Signed)
IM on call notified that patient is a Qday smoker and need a nicotine patch. Gay Filler

## 2021-03-07 ENCOUNTER — Inpatient Hospital Stay (HOSPITAL_COMMUNITY): Payer: Self-pay

## 2021-03-07 LAB — GLUCOSE, CAPILLARY: Glucose-Capillary: 95 mg/dL (ref 70–99)

## 2021-03-07 MED ORDER — REGADENOSON 0.4 MG/5ML IV SOLN
INTRAVENOUS | Status: AC
  Filled 2021-03-07: qty 5

## 2021-03-07 MED ORDER — TECHNETIUM TC 99M TETROFOSMIN IV KIT
32.3000 | PACK | Freq: Once | INTRAVENOUS | Status: AC | PRN
  Administered 2021-03-07: 32.3 via INTRAVENOUS

## 2021-03-07 MED ORDER — REGADENOSON 0.4 MG/5ML IV SOLN
0.4000 mg | Freq: Once | INTRAVENOUS | Status: AC
  Administered 2021-03-07: 0.4 mg via INTRAVENOUS

## 2021-03-07 MED ORDER — TECHNETIUM TC 99M TETROFOSMIN IV KIT
10.1000 | PACK | Freq: Once | INTRAVENOUS | Status: AC | PRN
  Administered 2021-03-07: 10.1 via INTRAVENOUS

## 2021-03-07 MED ORDER — ONDANSETRON 4 MG PO TBDP
4.0000 mg | ORAL_TABLET | Freq: Once | ORAL | Status: AC | PRN
  Administered 2021-03-07: 4 mg via ORAL
  Filled 2021-03-07: qty 1

## 2021-03-07 NOTE — Progress Notes (Signed)
Patient returned from stress test, VSS. MD at bedside. MD ok for patient to shower at this time. No other distress noted or concerns voiced.

## 2021-03-07 NOTE — Progress Notes (Signed)
Subjective:  Doing well denies any chest pain or shortness of breath tolerated stress portion of the nuclear test.  No acute EKG changes noted Myoview scan is pending  Objective:  Vital Signs in the last 24 hours: Temp:  [97.7 F (36.5 C)-98.4 F (36.9 C)] 97.9 F (36.6 C) (03/24 0515) Pulse Rate:  [81-114] 109 (03/24 1034) Resp:  [16-18] 16 (03/24 0515) BP: (113-135)/(77-90) 113/77 (03/24 1034) SpO2:  [95 %-98 %] 98 % (03/24 0515) Weight:  [85.5 kg] 85.5 kg (03/24 0515)  Intake/Output from previous day: 03/23 0701 - 03/24 0700 In: -  Out: 200 [Urine:200] Intake/Output from this shift: No intake/output data recorded.  Physical Exam: Exam unchanged  Lab Results: Recent Labs    03/04/21 2155 03/06/21 0304  WBC 6.9 6.5  HGB 16.1 15.8  PLT 294 257   Recent Labs    03/04/21 2155 03/06/21 0304  NA 139 136  K 3.6 3.7  CL 108 105  CO2 21* 24  GLUCOSE 93 116*  BUN 13 12  CREATININE 0.80 0.88   No results for input(s): TROPONINI in the last 72 hours.  Invalid input(s): CK, MB Hepatic Function Panel Recent Labs    03/06/21 0304  PROT 6.1*  ALBUMIN 3.3*  AST 19  ALT 19  ALKPHOS 49  BILITOT 0.9   Recent Labs    03/05/21 0008  CHOL 158   No results for input(s): PROTIME in the last 72 hours.  Imaging: Imaging results have been reviewed and No results found.  Cardiac Studies:  Assessment/plan recurrent chest pain MI ruled out with some features worrisome for angina Status post syncope rule out cardiac arrhythmia Tobacco abuse Elevated blood sugar Plan Continue present management Check nuclear stress test results Okay to discharge from cardiac point of view if no evidence of reversible ischemia Follow-up with me in 2 weeks  LOS: 1 day    Rinaldo Cloud 03/07/2021, 10:37 AM

## 2021-03-07 NOTE — Progress Notes (Signed)
Patient having nausea MD on call paged will continue to monitor. Awaiting new orders Ilean Skill LPN

## 2021-03-08 ENCOUNTER — Inpatient Hospital Stay (HOSPITAL_COMMUNITY): Payer: Self-pay

## 2021-03-08 ENCOUNTER — Inpatient Hospital Stay: Payer: Medicaid Other | Admitting: Nurse Practitioner

## 2021-03-08 LAB — GLUCOSE, CAPILLARY: Glucose-Capillary: 89 mg/dL (ref 70–99)

## 2021-03-08 MED ORDER — GADOBUTROL 1 MMOL/ML IV SOLN
8.0000 mL | Freq: Once | INTRAVENOUS | Status: AC | PRN
  Administered 2021-03-08: 8 mL via INTRAVENOUS

## 2021-03-08 NOTE — Progress Notes (Signed)
Subjective:  Patient just left for MRI of the brain.  Per wife no further chest pain he is doing well nuclear stress test showed no evidence of reversible ischemia or infarction although mildly depressed LV systolic function  Objective:  Vital Signs in the last 24 hours: Temp:  [97.6 F (36.4 C)-98.2 F (36.8 C)] 97.6 F (36.4 C) (03/25 0604) Pulse Rate:  [85-96] 85 (03/25 0604) Resp:  [18] 18 (03/25 0604) BP: (116-124)/(80-99) 116/80 (03/25 0604) SpO2:  [94 %-95 %] 95 % (03/25 0604) Weight:  [84.8 kg] 84.8 kg (03/25 0604)  Intake/Output from previous day: 03/24 0701 - 03/25 0700 In: 1080 [P.O.:1080] Out: -  Intake/Output from this shift: No intake/output data recorded.  Physical Exam: Unchanged  Lab Results: Recent Labs    03/06/21 0304  WBC 6.5  HGB 15.8  PLT 257   Recent Labs    03/06/21 0304  NA 136  K 3.7  CL 105  CO2 24  GLUCOSE 116*  BUN 12  CREATININE 0.88   No results for input(s): TROPONINI in the last 72 hours.  Invalid input(s): CK, MB Hepatic Function Panel Recent Labs    03/06/21 0304  PROT 6.1*  ALBUMIN 3.3*  AST 19  ALT 19  ALKPHOS 49  BILITOT 0.9   No results for input(s): CHOL in the last 72 hours. No results for input(s): PROTIME in the last 72 hours.  Imaging: Imaging results have been reviewed and CT HEAD WO CONTRAST  Result Date: 03/07/2021 CLINICAL DATA:  Headache, new or worsening, cancer history, LOC. EXAM: CT HEAD WITHOUT CONTRAST TECHNIQUE: Contiguous axial images were obtained from the base of the skull through the vertex without intravenous contrast. COMPARISON:  No pertinent prior exams available for comparison. FINDINGS: Brain: Cerebral volume is normal. Minimal patchy hypoattenuation within the cerebral white matter, nonspecific but most often secondary to chronic small vessel ischemia. There is no acute intracranial hemorrhage. No demarcated cortical infarct. No extra-axial fluid collection. No evidence of intracranial  mass. No midline shift. Vascular: No hyperdense vessel. Skull: Normal. Negative for fracture or focal lesion. Sinuses/Orbits: Visualized orbits show no acute finding. Trace bilateral ethmoid sinus mucosal thickening. Mild mucosal thickening within the left sphenoid sinus. IMPRESSION: No evidence of acute intracranial abnormality. Minimal patchy hypoattenuation within the cerebral white matter, nonspecific but most often secondary to chronic small vessel ischemia. Mild bilateral ethmoid and left sphenoid sinus mucosal thickening. Electronically Signed   By: Jackey Loge DO   On: 03/07/2021 15:54   NM Myocar Multi W/Spect W/Wall Motion / EF  Result Date: 03/07/2021 CLINICAL DATA:  Chest pain. EXAM: MYOCARDIAL IMAGING WITH SPECT (REST AND PHARMACOLOGIC-STRESS) GATED LEFT VENTRICULAR WALL MOTION STUDY LEFT VENTRICULAR EJECTION FRACTION TECHNIQUE: Standard myocardial SPECT imaging was performed after resting intravenous injection of 10 mCi Tc-79m tetrofosmin. Subsequently, intravenous infusion of Lexiscan was performed under the supervision of the Cardiology staff. At peak effect of the drug, 32.3 mCi Tc-38m tetrofosmin was injected intravenously and standard myocardial SPECT imaging was performed. Quantitative gated imaging was also performed to evaluate left ventricular wall motion, and estimate left ventricular ejection fraction. COMPARISON:  None. FINDINGS: Perfusion: No decreased activity in the left ventricle on stress imaging to suggest reversible ischemia or infarction. Wall Motion: Normal left ventricular wall motion. No left ventricular dilation. Left Ventricular Ejection Fraction: 46 % End diastolic volume 114 ml End systolic volume 62 ml IMPRESSION: 1. No reversible ischemia or infarction. 2. Normal left ventricular wall motion. 3. Left ventricular ejection fraction 46%  4. Non invasive risk stratification*: Intermediate *2012 Appropriate Use Criteria for Coronary Revascularization Focused Update: J Am  Coll Cardiol. 2012;59(9):857-881. http://[email or web address]?articleid=[email or web address] Electronically Signed   By: Rudie Meyer M.D.   On: 03/07/2021 14:31    Cardiac Studies:  Assessment/Plan:  Status post noncardiac chest pain negative nuclear stress test Status post syncope work-up in progress Tobacco abuse Elevated blood sugar Plan Okay to discharge from cardiac point of view We will arrange for event monitor as appropriate as outpatient  Follow-up with me in 2 weeks   LOS: 2 days    Rinaldo Cloud 03/08/2021, 12:15 PM

## 2021-03-08 NOTE — Discharge Summary (Signed)
Name: Roger Peterson MRN: [id number] DOB: 03/29/1976 45 y.o. PCP: Pcp, No  Date of Admission: 03/04/2021  9:36 PM Date of Discharge:  03/08/2021 Attending Physician: Miguel Aschoff, MD  Discharge Diagnosis: 1. Typical Chest Pain  2. Syncope  Discharge Medications: Allergies as of 03/08/2021   No Known Allergies     Medication List    You have not been prescribed any medications.     Disposition and follow-up:   [name] was discharged from [facility] in Stable condition.  At the hospital follow up visit please address:  1. Typical Chest Pain. Negative cardiac workup (EKG, trops, ECHO, and nuclear stress test). F/u with Dr. Sharyn Lull in 2 weeks for long-term monitor. F/u with [facility] on Apr 18, 2021 to establish PCP. Encourage smoking cessation.  2. Syncope. Negative workup (ECHO, orthostatics, telemetry, stress test).   2.  Labs / imaging needed at time of follow-up: CMP  3.  Pending labs/ test needing follow-up: None  Follow-up Appointments:  Follow-up Information    [facility] Follow up.   Why: Apr 18, 2021  at 10:10 am  Contact information: [address] [place] [address] [phone]       Rinaldo Cloud, MD Follow up in 2 week(s).   Specialty: Cardiology Contact information: [address] [place] [address] [phone]               Hospital Course by problem list: 1. Typical Chest Pain. Patient presented with episodic chest pain that was exertional in nature, present at rest and with exertion. Cardiac workup grossly negative with normal EKG, flat troponins, ECHO negative for valvular/structural abnormalities. HbA1c and TSH wnl. Cardiology consulted, planned for nuclear stress test which was negative. Scheduled f/u visit with Dr. Sharyn Lull, cardiology, in 2 weeks for possible long term monitor placement. F/u at [facility] on Apr 18, 2021 to establish new PCP.  Needs extensive counseling on smoking cessation.  2. Syncope with headache. Prodromal symptoms with chest pain and also had headache during 1 of 2 syncopal episodes, but also had chest pain without syncope. Normal orthostatic vitals upon admission, telemetry was unrevealing, ECHO without valvular/structural abnormalities, and negative nuclear stress test. Had a sudden onset severe headache that came on yesterday, description similar to "thunderclap" headache. Evaluated with CT head without contrast and MRI brain, both of which were unremarkable. Plan for discharge today with outpatient f/u with Dr. Sharyn Lull in 2 weeks for long term monitor placement. F/u at [facility] on Apr 18, 2021 to establish new PCP.   Discharge Exam:   BP 116/80 (BP Location: Left Arm)   Pulse 85   Temp 97.6 F (36.4 C) (Oral)   Resp 18   Ht 5\' 8"  (1.727 m)   Wt 84.8 kg   SpO2 95%   BMI 28.43 kg/m  Discharge exam:  General: middle aged male, sitting up in bed, NAD. HENT: NCAT, minimal tenderness in posterior neck, mucous membranes pink and moist. Eyes: EOMI CV: normal rate and regular rhythm. Pulmonary: respiratory effort normal, no respiratory distress. Skin: warm and dry Neuro: AAOx3, no focal deficits. Able to perform serial 2 subtractions and serial 7 subtractions. Psych: normal mood and appropriate affect.  Pertinent Labs, Studies, and Procedures:  CBC Latest Ref Rng & Units 03/06/2021 03/04/2021  WBC 4.0 - 10.5 K/uL 6.5 6.9  Hemoglobin 13.0 - 17.0 g/dL 03/06/2021 16.1  Hematocrit 39.0 - 52.0 % 45.3 47.9  Platelets 150 - 400 K/uL 257 294   CMP Latest Ref Rng & Units 03/06/2021 03/04/2021  Glucose 70 - 99 mg/dL 161(W116(H) 93  BUN 6 - 20 mg/dL 12 13  Creatinine 9.600.61 - 1.24 mg/dL 4.540.88 0.980.80  Sodium 119135 - 145 mmol/L 136 139  Potassium 3.5 - 5.1 mmol/L 3.7 3.6  Chloride 98 - 111 mmol/L 105 108  CO2 22 - 32 mmol/L 24 21(L)  Calcium 8.9 - 10.3  mg/dL 1.4(N8.7(L) 8.2(N8.6(L)  Total Protein 6.5 - 8.1 g/dL 6.1(L) -  Total Bilirubin 0.3 - 1.2 mg/dL 0.9 -  Alkaline Phos 38 - 126 U/L 49 -  AST 15 - 41 U/L 19 -  ALT 0 - 44 U/L 19 -   Urinalysis    Component Value Date/Time   COLORURINE YELLOW 03/04/2021 2148   APPEARANCEUR CLEAR 03/04/2021 2148   LABSPEC 1.027 03/04/2021 2148   PHURINE 6.0 03/04/2021 2148   GLUCOSEU NEGATIVE 03/04/2021 2148   HGBUR NEGATIVE 03/04/2021 2148   BILIRUBINUR NEGATIVE 03/04/2021 2148   KETONESUR 20 (A) 03/04/2021 2148   PROTEINUR NEGATIVE 03/04/2021 2148   NITRITE NEGATIVE 03/04/2021 2148   LEUKOCYTESUR NEGATIVE 03/04/2021 2148    Lipase     Component Value Date/Time   LIPASE 35 03/04/2021 2155   Troponins  4  >>  3  >>  <2  TSH 1.918  HbA1c 5.3   DG Chest Portable 1 View  Result Date: 03/05/2021 CLINICAL DATA:  Chest pain EXAM: PORTABLE CHEST 1 VIEW COMPARISON:  None. FINDINGS: The heart size and mediastinal contours are within normal limits. Both lungs are clear. The visualized skeletal structures are unremarkable. IMPRESSION: No active disease. Electronically Signed   By: Alcide CleverMark  Lukens M.D.   On: 03/05/2021 08:05   ECHOCARDIOGRAM COMPLETE  Result Date: 03/05/2021    ECHOCARDIOGRAM REPORT   Patient Name:   Roger Peterson Date of Exam: 03/05/2021 Medical Rec #:  [id number]        Height:       68.0 in Accession #:    [id number]       Weight:       180.0 lb Date of Birth:  01/08/1976        BSA:          1.954 m Patient Age:    44 years         BP:           120/64 mmHg Patient Gender: M                HR:           85 bpm. Exam Location:  Inpatient Procedure: 2D Echo, 3D Echo, Cardiac Doppler, Color Doppler and Strain Analysis Indications:    Syncope  History:        Patient has prior history of Echocardiogram examinations and                 Patient has no prior history of Echocardiogram examinations.                 Signs/Symptoms:Syncope.  Sonographer:    Neomia DearAMARA CROWN RDCS Referring Phys: [id number] NATHAN  PICKERING IMPRESSIONS  1. Left ventricular ejection fraction, by estimation, is 55 to 60%. The left ventricle has normal function. The left ventricle has no regional wall motion abnormalities. Left ventricular diastolic parameters were normal.  2. Right ventricular systolic function is normal. The right ventricular size is normal.  There is normal pulmonary artery systolic pressure. The estimated right ventricular systolic pressure is 15.4 mmHg.  3. The mitral valve is grossly normal. No evidence of mitral valve regurgitation. No evidence of mitral stenosis.  4. The aortic valve is tricuspid. Aortic valve regurgitation is not visualized. No aortic stenosis is present.  5. The inferior vena cava is normal in size with greater than 50% respiratory variability, suggesting right atrial pressure of 3 mmHg. Conclusion(s)/Recommendation(s): Normal biventricular function without evidence of hemodynamically significant valvular heart disease. FINDINGS  Left Ventricle: Left ventricular ejection fraction, by estimation, is 55 to 60%. The left ventricle has normal function. The left ventricle has no regional wall motion abnormalities. Global longitudinal strain performed but not reported based on interpreter judgement due to suboptimal tracking. The left ventricular internal cavity size was normal in size. There is no left ventricular hypertrophy. Left ventricular diastolic parameters were normal. Right Ventricle: The right ventricular size is normal. No increase in right ventricular wall thickness. Right ventricular systolic function is normal. There is normal pulmonary artery systolic pressure. The tricuspid regurgitant velocity is 1.76 m/s, and  with an assumed right atrial pressure of 3 mmHg, the estimated right ventricular systolic pressure is 15.4 mmHg. Left Atrium: Left atrial size was normal in size. Right Atrium: Right atrial size was normal in size. Pericardium: Trivial pericardial effusion is present. Mitral Valve:  The mitral valve is grossly normal. No evidence of mitral valve regurgitation. No evidence of mitral valve stenosis. MV peak gradient, 2.0 mmHg. The mean mitral valve gradient is 1.0 mmHg. Tricuspid Valve: The tricuspid valve is grossly normal. Tricuspid valve regurgitation is not demonstrated. No evidence of tricuspid stenosis. Aortic Valve: The aortic valve is tricuspid. Aortic valve regurgitation is not visualized. No aortic stenosis is present. Aortic valve mean gradient measures 2.0 mmHg. Aortic valve peak gradient measures 4.2 mmHg. Aortic valve area, by VTI measures 3.44 cm. Pulmonic Valve: The pulmonic valve was grossly normal. Pulmonic valve regurgitation is not visualized. No evidence of pulmonic stenosis. Aorta: The aortic root and ascending aorta are structurally normal, with no evidence of dilitation. Venous: The inferior vena cava is normal in size with greater than 50% respiratory variability, suggesting right atrial pressure of 3 mmHg. IAS/Shunts: The atrial septum is grossly normal.  LEFT VENTRICLE PLAX 2D LVIDd:         4.20 cm  Diastology LVIDs:         2.30 cm  LV e' medial:    9.25 cm/s LV PW:         1.20 cm  LV E/e' medial:  8.0 LV IVS:        1.30 cm  LV e' lateral:   12.90 cm/s LVOT diam:     2.50 cm  LV E/e' lateral: 5.7 LV SV:         74 LV SV Index:   38 LVOT Area:     4.91 cm  RIGHT VENTRICLE RV S prime:     11.20 cm/s  PULMONARY VEINS TAPSE (M-mode): 2.2 cm      A Reversal Duration: 71.00 msec                             Diastolic Velocity:  29.20 cm/s                             S/D Velocity:  1.30                             Systolic Velocity:   37.30 cm/s LEFT ATRIUM           Index       RIGHT ATRIUM           Index LA diam:      3.10 cm 1.59 cm/m  RA Area:     12.40 cm LA Vol (A4C): 40.0 ml 20.47 ml/m RA Volume:   27.80 ml  14.23 ml/m  AORTIC VALVE                   PULMONIC VALVE AV Area (Vmax):    3.80 cm    PV Vmax:       0.91 m/s AV Area (Vmean):   3.83 cm    PV  Vmean:      64.600 cm/s AV Area (VTI):     3.44 cm    PV VTI:        0.174 m AV Vmax:           103.00 cm/s PV Peak grad:  3.3 mmHg AV Vmean:          75.700 cm/s PV Mean grad:  2.0 mmHg AV VTI:            0.214 m AV Peak Grad:      4.2 mmHg AV Mean Grad:      2.0 mmHg LVOT Vmax:         79.80 cm/s LVOT Vmean:        59.100 cm/s LVOT VTI:          0.150 m LVOT/AV VTI ratio: 0.70  AORTA Ao Root diam: 3.50 cm Ao Asc diam:  2.90 cm MITRAL VALVE               TRICUSPID VALVE MV Area (PHT): 3.93 cm    TR Peak grad:   12.4 mmHg MV Area VTI:   4.07 cm    TR Vmax:        176.00 cm/s MV Peak grad:  2.0 mmHg MV Mean grad:  1.0 mmHg    SHUNTS MV Vmax:       0.71 m/s    Systemic VTI:  0.15 m MV Vmean:      52.0 cm/s   Systemic Diam: 2.50 cm MV Decel Time: 193 msec MV E velocity: 73.70 cm/s MV A velocity: 62.60 cm/s MV E/A ratio:  1.18 Lennie Odor MD Electronically signed by Lennie Odor MD Signature Date/Time: 03/05/2021/11:37:42 AM    Final     Discharge Instructions: Discharge Instructions    Call MD for:  difficulty breathing, headache or visual disturbances   Complete by: As directed    Call MD for:  extreme fatigue   Complete by: As directed    Call MD for:  hives   Complete by: As directed    Call MD for:  persistant dizziness or light-headedness   Complete by: As directed    Call MD for:  persistant nausea and vomiting   Complete by: As directed    Call MD for:  redness, tenderness, or signs of infection (pain, swelling, redness, odor or green/yellow discharge around incision site)   Complete by: As directed    Call MD for:  severe uncontrolled pain   Complete by: As directed    Call MD for:  temperature >  100.4   Complete by: As directed    Diet - low sodium heart healthy   Complete by: As directed    Discharge instructions   Complete by: As directed    Mr Boylen, it was a pleasure taking care of you during your stay here. You came in with chest pain and loss of consciousness, but were  found to not have anything wrong.  Please take note of the following:  1. Please see Dr. Sharyn Lull (Heart doctor) in 2 weeks for your long term monitor.  2. Please go to [facility] on Apr 18, 2021 to visit your new primary care doctor.  3. If you have another bad headache like the one in the hospital, please go to the emergency department.   Increase activity slowly   Complete by: As directed       Signed: Merrilyn Puma, MD 03/08/2021, 4:30 PM   Pager: [phone]

## 2021-03-27 ENCOUNTER — Ambulatory Visit (INDEPENDENT_AMBULATORY_CARE_PROVIDER_SITE_OTHER): Payer: No Payment, Other | Admitting: Clinical

## 2021-03-27 ENCOUNTER — Other Ambulatory Visit: Payer: Self-pay

## 2021-03-27 DIAGNOSIS — F411 Generalized anxiety disorder: Secondary | ICD-10-CM | POA: Diagnosis not present

## 2021-03-27 DIAGNOSIS — F4310 Post-traumatic stress disorder, unspecified: Secondary | ICD-10-CM

## 2021-03-30 DIAGNOSIS — F4310 Post-traumatic stress disorder, unspecified: Secondary | ICD-10-CM | POA: Insufficient documentation

## 2021-03-30 DIAGNOSIS — F411 Generalized anxiety disorder: Secondary | ICD-10-CM | POA: Insufficient documentation
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
## 2021-04-18 ENCOUNTER — Inpatient Hospital Stay: Payer: Medicaid Other | Admitting: Physician Assistant

## 2021-05-14 ENCOUNTER — Ambulatory Visit (HOSPITAL_COMMUNITY): Payer: No Payment, Other | Admitting: Physician Assistant

## 2021-05-22 ENCOUNTER — Ambulatory Visit (HOSPITAL_COMMUNITY): Payer: No Payment, Other | Admitting: Physician Assistant

## 2021-05-22 ENCOUNTER — Other Ambulatory Visit: Payer: Self-pay

## 2021-05-22 ENCOUNTER — Ambulatory Visit (INDEPENDENT_AMBULATORY_CARE_PROVIDER_SITE_OTHER): Payer: No Payment, Other | Admitting: Physician Assistant

## 2021-05-22 ENCOUNTER — Encounter (HOSPITAL_COMMUNITY): Payer: Self-pay | Admitting: Physician Assistant

## 2021-05-22 DIAGNOSIS — F411 Generalized anxiety disorder: Secondary | ICD-10-CM

## 2021-05-22 DIAGNOSIS — F4310 Post-traumatic stress disorder, unspecified: Secondary | ICD-10-CM

## 2021-05-22 DIAGNOSIS — F3112 Bipolar disorder, current episode manic without psychotic features, moderate: Secondary | ICD-10-CM | POA: Diagnosis not present

## 2021-05-22 MED ORDER — HYDROXYZINE HCL 10 MG PO TABS: 10.0000 mg | ORAL_TABLET | Freq: Three times a day (TID) | ORAL | 1 refills | Status: DC | PRN

## 2021-05-22 MED ORDER — QUETIAPINE FUMARATE 50 MG PO TABS
50.0000 mg | ORAL_TABLET | Freq: Every day | ORAL | 1 refills | Status: DC
Start: 2021-05-22 — End: 2021-07-19

## 2021-05-22 NOTE — Progress Notes (Signed)
Psychiatric Initial Adult Assessment   Patient Identification: Roger Peterson MRN:  [id number] Date of Evaluation:  05/22/2021 Referral Source: N/A Chief Complaint:  "Real bad anxiety." Visit Diagnosis:    ICD-10-CM   1. Generalized anxiety disorder  F41.1 hydrOXYzine (ATARAX/VISTARIL) 10 MG tablet  2. Posttraumatic stress disorder  F43.10 QUEtiapine (SEROQUEL) 50 MG tablet  3. Bipolar affective disorder, currently manic, moderate (HCC)  F31.12 QUEtiapine (SEROQUEL) 50 MG tablet    History of Present Illness:    Roger Peterson is a 45 year old male with no documented past psychiatric history who presents to [facility] for "real bad anxiety."  Patient further elaborates, "whenever I work, I get ill."   Patient endorses being unable to concentrate while enduring racing thoughts.  Patient reports that his symptoms have been worsening ever since being released from prison on December 26th, 2021.  Patient states that he was incarcerated for 20 years due to a sex-related crime.  Patient states that he was placed on medications while in prison but is unsure of the name.  He reports that he eventually discontinued taking the medication after experiencing fatigue while taking it.  Patient endorses anxiety he rates a 10 out of 10.  Triggers to the patient's anxiety include interacting with people and large crowds.  Patient's anxiety is sometimes alleviated via walking or sticking to himself.  He reports that his anxiety is often accompanied by shaking.  In addition to anxiety, patient reports that he is often prone to mood swings with his emotions often progressing to anger then to sadness.  Patient endorses daily outbursts without warning and states that his body feels like it is going 100 miles per hours.  Patient reports depression but feels that his mood is either up or down constantly.  Patient endorses mood swings, irritability, feeling like he can go  multiple days without sleep, hyperactivity, and lack of concentration.  Patient denies a history of being seen by a psychiatrist and does not recall any other time he has been placed on psychiatric medications except for when he was in prison.  Patient denies a past history of suicide attempt and denies self-injurious behavior.  A PHQ-9 screen was performed with the patient scoring a 13.  A GAD-7 screen was also performed with the patient scoring a 20.  A [place] Suicide Severity Rating Scale was performed with the patient being considered no risk.  Patient is visibly restless on exam but is cooperative and fully engaged in conversation during the encounter.  Patient denies suicidal or homicidal ideations and denies a past history of suicide attempt.  He further denies auditory or visual hallucinations and does not appear to be responding to internal/external stimuli.  Patient endorses poor sleep and receives on average 4 hours of intermittent sleep.  Patient endorses poor appetite and states that he does not eat too much.  Patient denies alcohol consumption, tobacco use, and illicit drug use.  Patient states that he has used marijuana in the past.  Associated Signs/Symptoms: Depression Symptoms:  depressed mood, anhedonia, psychomotor agitation, psychomotor retardation, fatigue, difficulty concentrating, impaired memory, anxiety, panic attacks, loss of energy/fatigue, disturbed sleep, decreased appetite, (Hypo) Manic Symptoms:  Distractibility, Elevated Mood, Flight of Ideas, Grandiosity, Impulsivity, Irritable Mood, Labiality of Mood, Anxiety Symptoms:  Agoraphobia, Excessive Worry, Panic Symptoms, Obsessive Compulsive Symptoms:   Checking, Patient states that he has a constant need to fix things, Social Anxiety, Psychotic Symptoms:  Paranoia, PTSD Symptoms: Had a traumatic exposure:  Patient states that being locked up in prison was traumatic for him Had a traumatic exposure in  the last month:  N/A Re-experiencing:  Flashbacks Intrusive Thoughts Nightmares Hypervigilance:  Yes Hyperarousal:  Difficulty Concentrating Emotional Numbness/Detachment Increased Startle Response Irritability/Anger Sleep Avoidance:  Decreased Interest/Participation Foreshortened Future  Past Psychiatric History:  No documented psychiatric history  Previous Psychotropic Medications: Yes   Substance Abuse History in the last 12 months:  Yes.    Consequences of Substance Abuse: Medical Consequences:  None Legal Consequences:  None Family Consequences:  None Blackouts:  N/A DT's: N/A Withdrawal Symptoms:   None  Past Medical History: No past medical history on file. No past surgical history on file.  Family Psychiatric History:  Patient reports that his mother takes Xanax but is unsure of her diagnosis  Family History: No family history on file.  Social History:   Social History   Socioeconomic History  . Marital status: Married    Spouse name: Not on file  . Number of children: Not on file  . Years of education: Not on file  . Highest education level: Not on file  Occupational History  . Not on file  Tobacco Use  . Smoking status: Current Every Day Smoker    Types: Cigarettes  . Smokeless tobacco: Former Engineer, water and Sexual Activity  . Alcohol use: Not on file  . Drug use: Not on file  . Sexual activity: Not on file  Other Topics Concern  . Not on file  Social History Narrative  . Not on file   Social Determinants of Health   Financial Resource Strain: Not on file  Food Insecurity: Not on file  Transportation Needs: Not on file  Physical Activity: Not on file  Stress: Not on file  Social Connections: Not on file    Additional Social History:  Patient has a past history of incarceration ans was in jail for roughly 20 years. Patient states that he recently got out on December 26th, 2022. Patient is currently employed and works in  Scientist, physiological.    Allergies:  No Known Allergies  Metabolic Disorder Labs: Lab Results  Component Value Date   HGBA1C 5.3 03/05/2021   MPG 105.41 03/05/2021   No results found for: PROLACTIN Lab Results  Component Value Date   CHOL 158 03/05/2021   TRIG 161 (H) 03/05/2021   HDL 65 03/05/2021   CHOLHDL 2.4 03/05/2021   VLDL 32 03/05/2021   LDLCALC 61 03/05/2021   Lab Results  Component Value Date   TSH 1.918 03/05/2021    Therapeutic Level Labs: No results found for: LITHIUM No results found for: CBMZ No results found for: VALPROATE  Current Medications: Current Outpatient Medications  Medication Sig Dispense Refill  . hydrOXYzine (ATARAX/VISTARIL) 10 MG tablet Take 1 tablet (10 mg total) by mouth 3 (three) times daily as needed for anxiety. 75 tablet 1  . QUEtiapine (SEROQUEL) 50 MG tablet Take 1 tablet (50 mg total) by mouth at bedtime. 30 tablet 1   No current facility-administered medications for this visit.    Musculoskeletal: Strength & Muscle Tone: within normal limits Gait & Station: normal Patient leans: N/A  Psychiatric Specialty Exam: Review of Systems  Psychiatric/Behavioral: Positive for agitation, decreased concentration and sleep disturbance. Negative for dysphoric mood, hallucinations, self-injury and suicidal ideas. The patient is nervous/anxious and is hyperactive.     There were no vitals taken for this visit.There is no height or weight on file to calculate BMI.  General  Appearance: Fairly Groomed  Eye Contact:  Good  Speech:  Clear and Coherent and Normal Rate  Volume:  Normal  Mood:  Anxious, Depressed and Irritable  Affect:  Congruent and Depressed  Thought Process:  Coherent, Goal Directed and Descriptions of Associations: Intact  Orientation:  Full (Time, Place, and Person)  Thought Content:  WDL  Suicidal Thoughts:  No  Homicidal Thoughts:  No  Memory:  Immediate;   Good Recent;   Good Remote;   Good  Judgement:  Good   Insight:  Good  Psychomotor Activity:  Restlessness  Concentration:  Concentration: Good and Attention Span: Good  Recall:  Good  Fund of Knowledge:Fair  Language: Good  Akathisia:  Negative  Handed:  Right  AIMS (if indicated):  not done  Assets:  Communication Skills Desire for Improvement Housing Social Support Vocational/Educational  ADL's:  Intact  Cognition: WNL  Sleep:  Fair   Screenings: GAD-7   Garment/textile technologist Visit from 05/22/2021 in [facility] Counselor from 03/27/2021 in [facility]  Total GAD-7 Score 20 21    PHQ2-9   Flowsheet Row Office Visit from 05/22/2021 in [place]  PHQ-2 Total Score 3  PHQ-9 Total Score 13    Flowsheet Row Office Visit from 05/22/2021 in [facility] Counselor from 03/27/2021 in [facility] ED to Hosp-Admission (Discharged) from 03/04/2021 in [facility] GENERAL MED/SURG UNIT  C-SSRS RISK CATEGORY No Risk No Risk No Risk      Assessment and Plan:   Roger Peterson is a 45 year old male with no documented past psychiatric history who presents to [facility] for "real bad anxiety."  Patient further elaborates, "whenever I work, I get ill."  In addition to worsening anxiety, patient endorses the following symptoms:  mood swings, irritability, feeling like he can go multiple days without sleep, hyperactivity, and lack of concentration.  Patient is currently not taking any psychiatric medications for the management of his symptoms.  Patient's symptoms of worsening anxiety, mood swings, irritability, hyperactivity, and lack of concentration suggest a diagnosis of bipolar disorder with an emphasis on manic symptoms.  Patient was recommended hydroxyzine 10 mg 3 times daily as needed for the management of his anxiety.  Patient was also recommended Seroquel 50 mg  at bedtime for the management of his mood, appetite, and sleep disturbances.  Patient was agreeable to recommendation.  Patient's medication to be e-prescribed to pharmacy of choice.  1. Generalized anxiety disorder  - hydrOXYzine (ATARAX/VISTARIL) 10 MG tablet; Take 1 tablet (10 mg total) by mouth 3 (three) times daily as needed for anxiety.  Dispense: 75 tablet; Refill: 1  2. Posttraumatic stress disorder  - QUEtiapine (SEROQUEL) 50 MG tablet; Take 1 tablet (50 mg total) by mouth at bedtime.  Dispense: 30 tablet; Refill: 1  3. Bipolar affective disorder, currently manic, moderate (HCC)  - QUEtiapine (SEROQUEL) 50 MG tablet; Take 1 tablet (50 mg total) by mouth at bedtime.  Dispense: 30 tablet; Refill: 1  Patient to follow up in 6 weeks  Meta Hatchet, PA 6/8/202210:55 AM

## 2021-05-31 ENCOUNTER — Inpatient Hospital Stay: Payer: Medicaid Other | Admitting: Internal Medicine

## 2021-06-04 ENCOUNTER — Ambulatory Visit (INDEPENDENT_AMBULATORY_CARE_PROVIDER_SITE_OTHER): Payer: No Payment, Other | Admitting: Clinical

## 2021-06-04 ENCOUNTER — Other Ambulatory Visit: Payer: Self-pay

## 2021-06-04 DIAGNOSIS — F411 Generalized anxiety disorder: Secondary | ICD-10-CM

## 2021-06-04 NOTE — Progress Notes (Signed)
   THERAPIST PROGRESS NOTE  Session Time: 25 minutes  Participation Level: Active  Behavioral Response: CasualAlertEuthymic  Type of Therapy: Individual Therapy  Treatment Goals addressed: Coping  Interventions: CBT and Supportive  Summary:  Roger Peterson is a 45 y.o. male who presents for the scheduled session oriented times five, appropriately dressed, and friendly. Client denied hallucinations and delusions. Client reported on today he is doing well. Client reported he started his psychotropic medications earlier this month. Client reported the Seroquel helps him get an adequate nights rest and keeps him relaxed. Client reported he cannot tell a difference with the hydroxyzine yet. Client reported it agitates his anxiety a little more. Client reported otherwise work has been going well for him and married life is treating him well. Client reported he does have days of not wanting to talk or leave the house but he manages to complete his daily responsibilities. Client reported the source of his depression at any given time has not been precedent by an obvious stressor. Client does report that he struggles with working on trusting his family and wife. Client reported he has never had someone to genuinely care about him so at times he has the tendency to withdraw. Client reported he does make note that he needs to work on processing his anger in a constructive way. Client reported when he has been triggered to anger it can be hard to control.    Suicidal/Homicidal: Nowithout intent/plan  Therapist Response:  Therapist began the session asking how he has been doing since last seen. Therapist used active listening, eye contact and positive emotional support while he discussed his thoughts and feelings. Therapist used CBT to ask the client open ended questions about triggering situations. Therapist assigned the client homework to practice self care activities and deep breathing  exercises. Client was scheduled for next appointment.    Plan: Return again in 5 weeks.  Diagnosis: Generalized anxiety disorder    Neena Rhymes Roger Tigges, LCSW 06/04/2021

## 2021-06-18 ENCOUNTER — Ambulatory Visit (HOSPITAL_COMMUNITY): Payer: Self-pay | Admitting: Clinical

## 2021-07-05 ENCOUNTER — Encounter (HOSPITAL_COMMUNITY): Payer: No Payment, Other | Admitting: Physician Assistant

## 2021-07-12 ENCOUNTER — Other Ambulatory Visit (HOSPITAL_COMMUNITY): Payer: Self-pay | Admitting: Physician Assistant

## 2021-07-12 ENCOUNTER — Telehealth (HOSPITAL_COMMUNITY): Payer: Self-pay | Admitting: *Deleted

## 2021-07-12 DIAGNOSIS — F411 Generalized anxiety disorder: Secondary | ICD-10-CM

## 2021-07-12 MED ORDER — HYDROXYZINE HCL 10 MG PO TABS
10.0000 mg | ORAL_TABLET | Freq: Three times a day (TID) | ORAL | 1 refills | Status: DC | PRN
Start: 2021-07-12 — End: 2022-07-23

## 2021-07-12 NOTE — Progress Notes (Signed)
Provider was contacted by Direce E McIntyre, RMA regarding medication refill. Patient's medication to be e-prescribed to pharmacy of choice.

## 2021-07-12 NOTE — Telephone Encounter (Signed)
Rx Refill Request: hydrOXYzine (ATARAX/VISTARIL) 10 MG tablet

## 2021-07-12 NOTE — Telephone Encounter (Signed)
Provider was contacted by Direce E McIntyre, RMA regarding medication refill. Patient's medication to be e-prescribed to pharmacy of choice.

## 2021-07-18 ENCOUNTER — Telehealth (HOSPITAL_COMMUNITY): Payer: Self-pay | Admitting: *Deleted

## 2021-07-18 NOTE — Telephone Encounter (Signed)
Rx Refill request QUEtiapine (SEROQUEL) 50 MG tablet

## 2021-07-19 ENCOUNTER — Other Ambulatory Visit (HOSPITAL_COMMUNITY): Payer: Self-pay | Admitting: Physician Assistant

## 2021-07-19 DIAGNOSIS — F3112 Bipolar disorder, current episode manic without psychotic features, moderate: Secondary | ICD-10-CM

## 2021-07-19 DIAGNOSIS — F4310 Post-traumatic stress disorder, unspecified: Secondary | ICD-10-CM

## 2021-07-19 MED ORDER — QUETIAPINE FUMARATE 50 MG PO TABS
50.0000 mg | ORAL_TABLET | Freq: Every day | ORAL | 1 refills | Status: DC
Start: 2021-07-19 — End: 2022-07-23

## 2021-07-19 NOTE — Progress Notes (Signed)
Provider was contacted by Direce E McIntyre, RMA regarding medication refill. Patient's medication to be e-prescribed to pharmacy of choice.

## 2021-07-19 NOTE — Telephone Encounter (Signed)
Provider was contacted by Direce E McIntyre, RMA regarding medication refill. Patient's medication to be e-prescribed to pharmacy of choice.

## 2022-05-13 ENCOUNTER — Emergency Department (HOSPITAL_COMMUNITY): Payer: Commercial Managed Care - HMO

## 2022-05-13 ENCOUNTER — Emergency Department (HOSPITAL_COMMUNITY)
Admission: EM | Admit: 2022-05-13 | Discharge: 2022-05-13 | Disposition: A | Discharge status: Home / Self Care | Payer: Commercial Managed Care - HMO | Attending: Emergency Medicine | Admitting: Emergency Medicine

## 2022-05-13 ENCOUNTER — Other Ambulatory Visit: Payer: Self-pay

## 2022-05-13 DIAGNOSIS — R519 Headache, unspecified: Secondary | ICD-10-CM | POA: Insufficient documentation

## 2022-05-13 DIAGNOSIS — M545 Low back pain, unspecified: Secondary | ICD-10-CM | POA: Insufficient documentation

## 2022-05-13 DIAGNOSIS — M79602 Pain in left arm: Secondary | ICD-10-CM | POA: Diagnosis not present

## 2022-05-13 DIAGNOSIS — R202 Paresthesia of skin: Secondary | ICD-10-CM | POA: Diagnosis not present

## 2022-05-13 DIAGNOSIS — R0789 Other chest pain: Secondary | ICD-10-CM | POA: Diagnosis present

## 2022-05-13 LAB — CBC WITH DIFFERENTIAL/PLATELET
Abs Immature Granulocytes: 0.01 10*3/uL (ref 0.00–0.07)
Basophils Absolute: 0.1 10*3/uL (ref 0.0–0.1)
Basophils Relative: 1 %
Eosinophils Absolute: 0.4 10*3/uL (ref 0.0–0.5)
Eosinophils Relative: 5 %
HCT: 43.7 % (ref 39.0–52.0)
Hemoglobin: 15.2 g/dL (ref 13.0–17.0)
Immature Granulocytes: 0 %
Lymphocytes Relative: 27 %
Lymphs Abs: 1.9 10*3/uL (ref 0.7–4.0)
MCH: 33.2 pg (ref 26.0–34.0)
MCHC: 34.8 g/dL (ref 30.0–36.0)
MCV: 95.4 fL (ref 80.0–100.0)
Monocytes Absolute: 0.8 10*3/uL (ref 0.1–1.0)
Monocytes Relative: 11 %
Neutro Abs: 3.9 10*3/uL (ref 1.7–7.7)
Neutrophils Relative %: 56 %
Platelets: 267 10*3/uL (ref 150–400)
RBC: 4.58 MIL/uL (ref 4.22–5.81)
RDW: 12.9 % (ref 11.5–15.5)
WBC: 7 10*3/uL (ref 4.0–10.5)
nRBC: 0 % (ref 0.0–0.2)

## 2022-05-13 LAB — COMPREHENSIVE METABOLIC PANEL
ALT: 13 U/L (ref 0–44)
AST: 15 U/L (ref 15–41)
Albumin: 3.7 g/dL (ref 3.5–5.0)
Alkaline Phosphatase: 43 U/L (ref 38–126)
Anion gap: 9 (ref 5–15)
BUN: 14 mg/dL (ref 6–20)
CO2: 26 mmol/L (ref 22–32)
Calcium: 8.8 mg/dL — ABNORMAL LOW (ref 8.9–10.3)
Chloride: 105 mmol/L (ref 98–111)
Creatinine, Ser: 0.88 mg/dL (ref 0.61–1.24)
GFR, Estimated: >60 mL/min (ref >60)
Glucose, Bld: 86 mg/dL (ref 70–99)
Potassium: 3.6 mmol/L (ref 3.5–5.1)
Sodium: 140 mmol/L (ref 135–145)
Total Bilirubin: 0.5 mg/dL (ref 0.3–1.2)
Total Protein: 6.3 g/dL — ABNORMAL LOW (ref 6.5–8.1)

## 2022-05-13 LAB — TROPONIN I (HIGH SENSITIVITY)
Troponin I (High Sensitivity): <2 ng/L (ref <18)
Troponin I (High Sensitivity): <2 ng/L (ref <18)

## 2022-05-13 MED ORDER — IOHEXOL 350 MG/ML SOLN
100.0000 mL | Freq: Once | INTRAVENOUS | Status: AC | PRN
  Administered 2022-05-13: 100 mL via INTRAVENOUS

## 2022-05-13 MED ORDER — FENTANYL CITRATE PF 50 MCG/ML IJ SOSY
25.0000 ug | PREFILLED_SYRINGE | Freq: Once | INTRAMUSCULAR | Status: AC
  Administered 2022-05-13: 25 ug via INTRAVENOUS
  Filled 2022-05-13: qty 1

## 2022-05-13 NOTE — ED Provider Notes (Signed)
[facility] Provider Note   CSN: [id number] Arrival date & time: 05/13/22  1433     History  Chief Complaint  Patient presents with   Chest Pain    Roger Peterson is a 46 y.o. male presenting to the ED with with a chief complaint of chest pain, back pain, headache and left arm pain.  For the past week he has been experiencing sharp, stabbing left-sided chest pain which causes him to have a headache and left arm paresthesias.  Pain will sometimes radiate to his back.  Symptoms are worse when he moves around.  He has not taken any medication to help with his symptoms.  He was seen and evaluated at urgent care today, was told he had a widened mediastinum on his chest x-ray and was sent to the ER to rule out an aortic dissection.  No history of aneurysm that he is aware of.  He does report a family history of cardiac disease, patient himself is a smoker. Patient had an admission for chest pain rule out approximately 1 year ago.  He had an echo done which showed an EF of 55 to 60%.  States that since then "I have not had any issues."   Chest Pain Associated symptoms: back pain and headache   Associated symptoms: no abdominal pain, no cough, no dizziness, no fever, no nausea, no palpitations, no shortness of breath, no vomiting and no weakness       Home Medications Prior to Admission medications   Medication Sig Start Date End Date Taking? Authorizing Provider  hydrOXYzine (ATARAX/VISTARIL) 10 MG tablet Take 1 tablet (10 mg total) by mouth 3 (three) times daily as needed for anxiety. 07/12/21   Nwoko, Terese Door, PA  QUEtiapine (SEROQUEL) 50 MG tablet Take 1 tablet (50 mg total) by mouth at bedtime. 07/19/21 07/19/22  Malachy Mood, PA      Allergies    Patient has no known allergies.    Review of Systems   Review of Systems  Constitutional:  Negative for appetite change, chills and fever.  HENT:  Negative for ear pain, rhinorrhea, sneezing and  sore throat.   Eyes:  Negative for photophobia and visual disturbance.  Respiratory:  Negative for cough, chest tightness, shortness of breath and wheezing.   Cardiovascular:  Positive for chest pain. Negative for palpitations.  Gastrointestinal:  Negative for abdominal pain, blood in stool, constipation, diarrhea, nausea and vomiting.  Genitourinary:  Negative for dysuria, hematuria and urgency.  Musculoskeletal:  Positive for back pain. Negative for myalgias.  Skin:  Negative for rash.  Neurological:  Positive for headaches. Negative for dizziness, weakness and light-headedness.   Physical Exam Updated Vital Signs BP 106/73   Pulse 86   Temp 97.8 F (36.6 C) (Oral)   Resp (!) 22   Ht 5\' 6"  (1.676 m)   Wt 83.9 kg   SpO2 97%   BMI 29.86 kg/m  Physical Exam Vitals and nursing note reviewed.  Constitutional:      General: He is not in acute distress.    Appearance: He is well-developed.  HENT:     Head: Normocephalic and atraumatic.     Nose: Nose normal.  Eyes:     General: No scleral icterus.       Left eye: No discharge.     Conjunctiva/sclera: Conjunctivae normal.  Cardiovascular:     Rate and Rhythm: Normal rate and regular rhythm.     Heart sounds: Normal  heart sounds. No murmur heard.   No friction rub. No gallop.  Pulmonary:     Effort: Pulmonary effort is normal. No respiratory distress.     Breath sounds: Normal breath sounds.  Abdominal:     General: Bowel sounds are normal. There is no distension.     Palpations: Abdomen is soft.     Tenderness: There is no abdominal tenderness. There is no guarding.  Musculoskeletal:        General: Normal range of motion.     Cervical back: Normal range of motion and neck supple.     Right lower leg: No tenderness. No edema.     Left lower leg: No tenderness. No edema.     Comments: No lower extremity edema, erythema or calf tenderness bilaterally.  2+ DP pulse palpated bilaterally.  Skin:    General: Skin is warm and  dry.     Findings: No rash.  Neurological:     Mental Status: He is alert.     Motor: No abnormal muscle tone.     Coordination: Coordination normal.    ED Results / Procedures / Treatments   Labs (all labs ordered are listed, but only abnormal results are displayed) Labs Reviewed  COMPREHENSIVE METABOLIC PANEL - Abnormal; Notable for the following components:      Result Value   Calcium 8.8 (*)    Total Protein 6.3 (*)    All other components within normal limits  CBC WITH DIFFERENTIAL/PLATELET  TROPONIN I (HIGH SENSITIVITY)  TROPONIN I (HIGH SENSITIVITY)    EKG EKG Interpretation  Date/Time:  Tuesday May 13 2022 14:36:25 EDT Ventricular Rate:  82 PR Interval:  183 QRS Duration: 107 QT Interval:  351 QTC Calculation: 410 R Axis:   68 Text Interpretation: Sinus rhythm Consider left atrial enlargement Confirmed by Sherwood Gambler [phone]) on 05/13/2022 2:38:50 PM  Radiology CT Angio Chest/Abd/Pel for Dissection W and/or W/WO  Result Date: 05/13/2022 CLINICAL DATA:  Substernal chest pain radiating to the back. EXAM: CT ANGIOGRAPHY CHEST, ABDOMEN AND PELVIS TECHNIQUE: Non-contrast CT of the chest was initially obtained. Multidetector CT imaging through the chest, abdomen and pelvis was performed using the standard protocol during bolus administration of intravenous contrast. Multiplanar reconstructed images and MIPs were obtained and reviewed to evaluate the vascular anatomy. RADIATION DOSE REDUCTION: This exam was performed according to the departmental dose-optimization program which includes automated exposure control, adjustment of the mA and/or kV according to patient size and/or use of iterative reconstruction technique. CONTRAST:  17mL OMNIPAQUE IOHEXOL 350 MG/ML SOLN COMPARISON:  None Available. FINDINGS: CTA CHEST FINDINGS Cardiovascular: Preferential opacification of the thoracic aorta. No evidence of thoracic aortic aneurysm or dissection. No intramural hematoma. Normal  heart size. No pericardial effusion. No pulmonary embolism. Mediastinum/Nodes: No enlarged mediastinal, hilar, or axillary lymph nodes. Thyroid gland, trachea, and esophagus demonstrate no significant findings. Lungs/Pleura: Lungs are clear. No pleural effusion or pneumothorax. Musculoskeletal: No chest wall abnormality. No acute or significant osseous findings. Review of the MIP images confirms the above findings. CTA ABDOMEN AND PELVIS FINDINGS VASCULAR Aorta: Normal caliber aorta without aneurysm, dissection, vasculitis or significant stenosis. Celiac: Patent without evidence of aneurysm, dissection, vasculitis or significant stenosis. SMA: Patent without evidence of aneurysm, dissection, vasculitis or significant stenosis. Renals: Single right and two left renal arteries are patent without evidence of aneurysm, dissection, vasculitis, fibromuscular dysplasia or significant stenosis. IMA: Patent without evidence of aneurysm, dissection, vasculitis or significant stenosis. Inflow: Patent without evidence of aneurysm, dissection, vasculitis or  significant stenosis. Veins: No obvious venous abnormality within the limitations of this arterial phase study. Review of the MIP images confirms the above findings. NON-VASCULAR Hepatobiliary: No focal liver abnormality is seen. No gallstones, gallbladder wall thickening, or biliary dilatation. Pancreas: Unremarkable. No pancreatic ductal dilatation or surrounding inflammatory changes. Spleen: Normal in size without focal abnormality. Adrenals/Urinary Tract: Adrenal glands are unremarkable. Kidneys are normal, without renal calculi, solid lesion, or hydronephrosis. 1.4 cm simple cyst in the upper pole of the left kidney. No follow-up imaging is recommended. Bladder is unremarkable. Stomach/Bowel: Stomach is within normal limits. Appendix appears normal. No evidence of bowel wall thickening, distention, or inflammatory changes. Mild left-sided colonic diverticulosis.  Lymphatic: No enlarged abdominal or pelvic lymph nodes. Reproductive: Prostate is unremarkable. Other: No abdominal wall hernia or abnormality. No abdominopelvic ascites. No pneumoperitoneum. Musculoskeletal: No acute or significant osseous findings. 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 Review of the MIP images confirms the above findings. IMPRESSION: 1. No evidence of thoracoabdominal aortic aneurysm or dissection. 2. No acute abnormality in the chest, abdomen, or pelvis. Electronically Signed   By: Titus Dubin M.D.   On: 05/13/2022 18:18    Procedures Procedures    Medications Ordered in ED Medications  fentaNYL (SUBLIMAZE) injection 25 mcg (25 mcg Intravenous Given 05/13/22 1721)  iohexol (OMNIPAQUE) 350 MG/ML injection 100 mL (100 mLs Intravenous Contrast Given 05/13/22 1805)    ED Course/ Medical Decision Making/ A&P Clinical Course as of 05/13/22 1856  Tue May 13, 2022  1651 Troponin I (High Sensitivity): <2 [HK]  1819 Troponin I (High Sensitivity): <2 [HK]    Clinical Course User Index [HK] Delia Heady, PA-C                           Medical Decision Making Amount and/or Complexity of Data Reviewed Labs: ordered. Decision-making details documented in ED Course. Radiology: ordered.  Risk Prescription drug management.   46yo M presenting to the ED with a chief complaint of chest pain.  Symptoms have been intermittent for the past week.  He will experience sharp, stabbing left-sided chest pain with radiation to his back but will also cause him to have a headache and left arm paresthesias.  He has not take any medication help with the symptoms.  He had admission for chest pain rule out approximately 1 year ago.  He had echo done which showed an EF of 55 to 60%, reassuring tropes, EKG and nuclear stress test.  He has not had any issues with this since then. He was evaluated at outside urgent care today and was sent to the ER to rule out aortic  dissection based on possible widened mediastinum on his chest x-ray.  He has a murmur on exam today and states that this has been present since he was a child.  He denies any numbness, weakness, blurry vision, head injuries.  He has no neurological deficits on exam.  No lower extremity edema, erythema or calf tenderness noted concerning for DVT.  No recent immobilization.  EKG here shows sinus rhythm, no ischemic changes, no STEMI.  CBC, CMP, troponin negative x2.  CT of the abdomen pelvis to evaluate for dissection is negative for dissection, aneurysm or other acute abnormality in the chest, abdomen or pelvis.  He remains hemodynamically stable here.  He is low risk by heart score.  I doubt PE as a cause of his symptoms based on his risk factors and  presentation today.  I feel that he will benefit from cardiology follow-up outpatient.  He has seen cardiology in the past.  Patient is agreeable to the plan.  Return precautions given.   Patient is hemodynamically stable, in NAD, and able to ambulate in the ED. Evaluation does not show pathology that would require ongoing emergent intervention or inpatient treatment. I explained the diagnosis to the patient. Pain has been managed and has no complaints prior to discharge. Patient is comfortable with above plan and is stable for discharge at this time. All questions were answered prior to disposition. Strict return precautions for returning to the ED were discussed. Encouraged follow up with PCP.   An After Visit Summary was printed and given to the patient.   Portions of this note were generated with [employer]. Dictation errors may occur despite best attempts at proofreading.         Final Clinical Impression(s) / ED Diagnoses Final diagnoses:  Chest wall pain    Rx / DC Orders ED Discharge Orders     None         Delia Heady, PA-C 05/13/22 1857    Gareth Morgan, MD 05/14/22 [phone]

## 2022-05-13 NOTE — Discharge Instructions (Addendum)
Your CT scan today did not show any signs of aneurysm or dissection. Your other work-up today was reassuring as well. I feel that you will benefit from following up with a cardiologist.  You can go to the cardiologist you have seen in the past to follow-up with the 1 listed below. Return to the ER at any point if your symptoms worsen, you have severe worsening chest pain, shortness of breath, leg swelling, uncontrollable vomiting, loss of consciousness or lightheadedness

## 2022-05-13 NOTE — ED Notes (Signed)
Pt in room resting. NAD chest rising and falling.  

## 2022-05-13 NOTE — ED Notes (Addendum)
Pt reports CP and back pain. No injury. No SOB. Pain worse with activity.

## 2022-05-13 NOTE — ED Triage Notes (Signed)
Pt BIBA from UC for substernal CP that radiated to the back under the shoulder blade. At times pt reports L arm numbness with a HA. 324mg  ASA given at [facility].

## 2022-05-22 ENCOUNTER — Ambulatory Visit: Payer: Commercial Managed Care - HMO | Admitting: Cardiovascular Disease

## 2022-07-21 ENCOUNTER — Ambulatory Visit
Admission: EM | Admit: 2022-07-21 | Discharge: 2022-07-21 | Disposition: A | Discharge status: Home / Self Care | Payer: Commercial Managed Care - HMO | Attending: Nurse Practitioner | Admitting: Nurse Practitioner

## 2022-07-21 ENCOUNTER — Other Ambulatory Visit: Payer: Self-pay

## 2022-07-21 ENCOUNTER — Encounter (HOSPITAL_COMMUNITY): Payer: Self-pay

## 2022-07-21 ENCOUNTER — Emergency Department (HOSPITAL_COMMUNITY)
Admission: EM | Admit: 2022-07-21 | Discharge: 2022-07-21 | Discharge status: Against Medical Advice | Payer: Self-pay | Attending: Emergency Medicine | Admitting: Emergency Medicine

## 2022-07-21 DIAGNOSIS — R1030 Lower abdominal pain, unspecified: Secondary | ICD-10-CM

## 2022-07-21 DIAGNOSIS — Z5321 Procedure and treatment not carried out due to patient leaving prior to being seen by health care provider: Secondary | ICD-10-CM | POA: Insufficient documentation

## 2022-07-21 DIAGNOSIS — R300 Dysuria: Secondary | ICD-10-CM | POA: Insufficient documentation

## 2022-07-21 DIAGNOSIS — R195 Other fecal abnormalities: Secondary | ICD-10-CM

## 2022-07-21 DIAGNOSIS — K921 Melena: Secondary | ICD-10-CM | POA: Insufficient documentation

## 2022-07-21 LAB — COMPREHENSIVE METABOLIC PANEL
ALT: 15 U/L (ref 0–44)
AST: 15 U/L (ref 15–41)
Albumin: 3.9 g/dL (ref 3.5–5.0)
Alkaline Phosphatase: 46 U/L (ref 38–126)
Anion gap: 4 — ABNORMAL LOW (ref 5–15)
BUN: 11 mg/dL (ref 6–20)
CO2: 27 mmol/L (ref 22–32)
Calcium: 8.9 mg/dL (ref 8.9–10.3)
Chloride: 106 mmol/L (ref 98–111)
Creatinine, Ser: 0.87 mg/dL (ref 0.61–1.24)
GFR, Estimated: >60 mL/min (ref >60)
Glucose, Bld: 102 mg/dL — ABNORMAL HIGH (ref 70–99)
Potassium: 3.8 mmol/L (ref 3.5–5.1)
Sodium: 137 mmol/L (ref 135–145)
Total Bilirubin: 0.6 mg/dL (ref 0.3–1.2)
Total Protein: 7.1 g/dL (ref 6.5–8.1)

## 2022-07-21 LAB — POCT URINALYSIS DIP (MANUAL ENTRY)
Bilirubin, UA: NEGATIVE
Blood, UA: NEGATIVE
Glucose, UA: NEGATIVE mg/dL
Leukocytes, UA: NEGATIVE
Nitrite, UA: NEGATIVE
Protein Ur, POC: 30 mg/dL — AB
Spec Grav, UA: >=1.03 — AB (ref 1.010–1.025)
Urobilinogen, UA: 0.2 E.U./dL
pH, UA: 5.5 (ref 5.0–8.0)

## 2022-07-21 LAB — CBC
HCT: 45.4 % (ref 39.0–52.0)
Hemoglobin: 15.2 g/dL (ref 13.0–17.0)
MCH: 31.9 pg (ref 26.0–34.0)
MCHC: 33.5 g/dL (ref 30.0–36.0)
MCV: 95.2 fL (ref 80.0–100.0)
Platelets: 272 10*3/uL (ref 150–400)
RBC: 4.77 MIL/uL (ref 4.22–5.81)
RDW: 13.2 % (ref 11.5–15.5)
WBC: 5.2 10*3/uL (ref 4.0–10.5)
nRBC: 0 % (ref 0.0–0.2)

## 2022-07-21 LAB — LIPASE, BLOOD: Lipase: 33 U/L (ref 11–51)

## 2022-07-21 NOTE — ED Triage Notes (Signed)
Pt reports lower back pain and abdominal pain "feels lie a belt", burning when urinating x 3 days, Pt has not taken any meds for complaints.

## 2022-07-21 NOTE — ED Triage Notes (Signed)
Reports back and abdomen pain x3 days. Pt states he has not pooped or peed lately. Last BM yesterday. Last urination a few hours ago.

## 2022-07-21 NOTE — Discharge Instructions (Signed)
Please go to the Emergency Room for further evaluation of your symptoms 

## 2022-07-21 NOTE — ED Provider Notes (Signed)
[facility]    CSN: [id number] Arrival date & time: 07/21/22  1539      History   Chief Complaint Chief Complaint  Patient presents with   Back Pain   Dysuria         HPI KEYONDRE HEPBURN is a 46 y.o. male.   Patient presents with significant other for back pain radiating around to his lower abdomen that has been going on the past couple of days.  Denies recent accident, injury, or trauma to the back/abdomen.  He reports the pain is severe, is waking him up at night, and is constant.  He denies any pain rating down his leg.  Denies change in appetite and nausea/vomiting, however has not been able to have a bowel movement since yesterday.    He denies fevers, urinary frequency/incontinence.  Reports he quit drinking alcohol about 1 month ago.  He is having some pain with urination and reports his urine is much darker than normal.  He also endorses dark black stools.  Denies frequent NSAID use.  Has not taken anything for symptoms.  Movement and certain positions makes the pain worse.  Unknown what makes the pain better.    History reviewed. No pertinent past medical history.  Patient Active Problem List   Diagnosis Date Noted   Bipolar affective disorder, currently manic, moderate (HCC) 05/22/2021   Generalized anxiety disorder 03/30/2021   Posttraumatic stress disorder 03/30/2021   Syncope 03/05/2021   Chest pain     History reviewed. No pertinent surgical history.     Home Medications    Prior to Admission medications   Medication Sig Start Date End Date Taking? Authorizing Provider  hydrOXYzine (ATARAX/VISTARIL) 10 MG tablet Take 1 tablet (10 mg total) by mouth 3 (three) times daily as needed for anxiety. 07/12/21   Nwoko, Tommas Olp, PA  QUEtiapine (SEROQUEL) 50 MG tablet Take 1 tablet (50 mg total) by mouth at bedtime. 07/19/21 07/19/22  Meta Hatchet, PA    Family History History reviewed. No pertinent family history.  Social History Social History    Tobacco Use   Smoking status: Every Day    Types: Cigarettes   Smokeless tobacco: Former  Building services engineer Use: Never used  Substance Use Topics   Alcohol use: Not Currently   Drug use: Never     Allergies   Patient has no known allergies.   Review of Systems Review of Systems Per HPI  Physical Exam Triage Vital Signs ED Triage Vitals  Enc Vitals Group     BP 07/21/22 1551 103/73     Pulse Rate 07/21/22 1551 98     Resp 07/21/22 1551 18     Temp 07/21/22 1551 97.8 F (36.6 C)     Temp Source 07/21/22 1551 Oral     SpO2 07/21/22 1551 98 %     Weight --      Height --      Head Circumference --      Peak Flow --      Pain Score 07/21/22 1550 8     Pain Loc --      Pain Edu? --      Excl. in GC? --    No data found.  Updated Vital Signs BP 103/73 (BP Location: Right Arm)   Pulse 98   Temp 97.8 F (36.6 C) (Oral)   Resp 18   SpO2 98%   Visual Acuity Right Eye Distance:   Left  Eye Distance:   Bilateral Distance:    Right Eye Near:   Left Eye Near:    Bilateral Near:     Physical Exam Vitals reviewed.  Constitutional:      General: He is not in acute distress.    Appearance: Normal appearance. He is not toxic-appearing.     Comments: Patient hunched over when I enter examination room in apparent pain  HENT:     Head: Normocephalic and atraumatic.     Mouth/Throat:     Mouth: Mucous membranes are moist.     Pharynx: Oropharynx is clear.  Eyes:     General: No scleral icterus.    Extraocular Movements: Extraocular movements intact.  Pulmonary:     Effort: Pulmonary effort is normal. No respiratory distress.  Abdominal:     General: Abdomen is flat. Bowel sounds are increased.     Palpations: Abdomen is soft.     Tenderness: There is abdominal tenderness in the right lower quadrant, suprapubic area and left lower quadrant. There is no right CVA tenderness, left CVA tenderness or guarding. Negative signs include Murphy's sign and McBurney's  sign.  Skin:    General: Skin is warm and dry.     Capillary Refill: Capillary refill takes less than 2 seconds.     Coloration: Skin is not jaundiced or pale.     Findings: No erythema.  Neurological:     Mental Status: He is alert and oriented to person, place, and time.  Psychiatric:        Behavior: Behavior is cooperative.      UC Treatments / Results  Labs (all labs ordered are listed, but only abnormal results are displayed) Labs Reviewed  POCT URINALYSIS DIP (MANUAL ENTRY) - Abnormal; Notable for the following components:      Result Value   Ketones, POC UA trace (5) (*)    Spec Grav, UA >=1.030 (*)    Protein Ur, POC =30 (*)    All other components within normal limits    EKG   Radiology No results found.  Procedures Procedures (including critical care time)  Medications Ordered in UC Medications - No data to display  Initial Impression / Assessment and Plan / UC Course  I have reviewed the triage vital signs and the nursing notes.  Pertinent labs & imaging results that were available during my care of the patient were reviewed by me and considered in my medical decision making (see chart for details).    Patient is a pleasant, uncomfortable appearing 46 year old male presenting for low back rating to abdomen pain today.  Urinalysis shows trace ketones, elevated specific gravity, protein in his urine.  I am concerned for dehydration, and possible lower GI bleed given the dark stool.  I discussed options with patient including obtaining lab work for further evaluation here in urgent care, however we discussed that lab work turnaround would not be instantaneous.  Given significant pain and current symptoms, I recommended he be seen in the emergency room for further evaluation of symptoms.  Patient is in agreement to this plan and stable to transport via private vehicle at this time. Final Clinical Impressions(s) / UC Diagnoses   Final diagnoses:  Lower abdominal  pain  Dark stools     Discharge Instructions      - Please go to the Emergency Room for further evaluation of your symptoms   ED Prescriptions   None    PDMP not reviewed this encounter.   Bradly Bienenstock,  Guadlupe Spanish, NP 07/21/22 1623

## 2022-07-21 NOTE — ED Provider Triage Note (Signed)
Emergency Medicine Provider Triage Evaluation Note  Roger Peterson , a 46 y.o. male  was evaluated in triage.  Pt complains of abdominal pain radiating to the back, localized in the lower abdomen.  Patient reports that he has been having some dark stools.  Patient reports history of alcohol abuse, reports that he is having some dark urination as well as some pain with urination.  He was seen evaluated urgent care today, had overall unremarkable urinalysis although does seem to show some evidence of dehydration.  Sent for further evaluation in context of possible GI bleed..  Review of Systems  Positive: Abdominal pain, dysuria Negative: Fever, chills, nausea, vomiting  Physical Exam  BP 114/85 (BP Location: Left Arm)   Pulse 90   Temp 97.8 F (36.6 C) (Oral)   Resp 16   Ht 5\' 8"  (1.727 m)   Wt 77.1 kg   SpO2 99%   BMI 25.85 kg/m  Gen:   Awake, no distress   Resp:  Normal effort  MSK:   Moves extremities without difficulty  Other:  Ttp lower quadrants on palpation  Medical Decision Making  Medically screening exam initiated at 4:44 PM.  Appropriate orders placed.  Roger Peterson was informed that the remainder of the evaluation will be completed by another provider, this initial triage assessment does not replace that evaluation, and the importance of remaining in the ED until their evaluation is complete.  Workup initiated   Gabriel Earing, Olene Floss 07/21/22 1645

## 2022-07-23 ENCOUNTER — Other Ambulatory Visit (HOSPITAL_COMMUNITY): Payer: Self-pay | Admitting: Physician Assistant

## 2022-07-23 DIAGNOSIS — F4310 Post-traumatic stress disorder, unspecified: Secondary | ICD-10-CM

## 2022-07-23 DIAGNOSIS — F3112 Bipolar disorder, current episode manic without psychotic features, moderate: Secondary | ICD-10-CM

## 2022-07-23 DIAGNOSIS — F411 Generalized anxiety disorder: Secondary | ICD-10-CM

## 2022-07-23 MED ORDER — QUETIAPINE FUMARATE 50 MG PO TABS: 50.0000 mg | ORAL_TABLET | Freq: Every day | ORAL | 0 refills | Status: AC

## 2022-07-23 MED ORDER — HYDROXYZINE HCL 10 MG PO TABS: 10.0000 mg | ORAL_TABLET | Freq: Three times a day (TID) | ORAL | 0 refills | Status: AC | PRN

## 2022-07-23 NOTE — Progress Notes (Signed)
Provider to prescribe a 30-day supply of medications for patient.  Provider request for patient to be scheduled for a follow-up appointment.

## 2022-07-23 NOTE — Telephone Encounter (Signed)
Provider to prescribe a 30-day supply of medications for patient.  Provider request for patient to be scheduled for a follow-up appointment. 

## 2023-07-26 IMAGING — CT CT ANGIO CHEST-ABD-PELV FOR DISSECTION W/ AND WO/W CM
2 of 8 series · 13 of 46 positions shown, 15 images · non-contrast
Comparison: None Available.

CLINICAL DATA: Substernal chest pain radiating to the back.

EXAM:
CT ANGIOGRAPHY CHEST, ABDOMEN AND PELVIS
TECHNIQUE: Non-contrast CT of the chest was initially obtained.

[Series 8: dissection 2mm · axial · 0.70mm/px · z∈[-571,-31]mm · 10 of 307 slices shown, 12 images]
[im 19/307  soft-tissue]
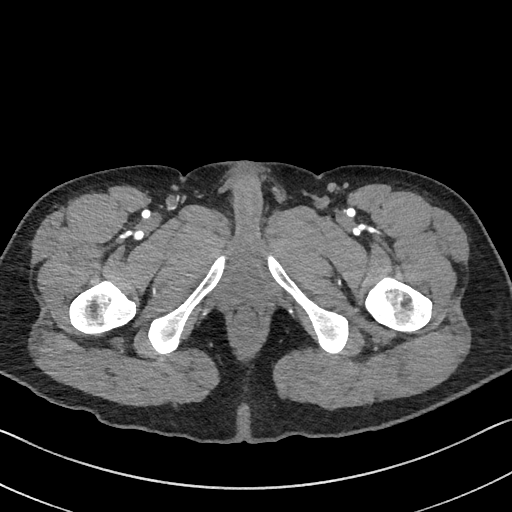
[im 19/307  bone]
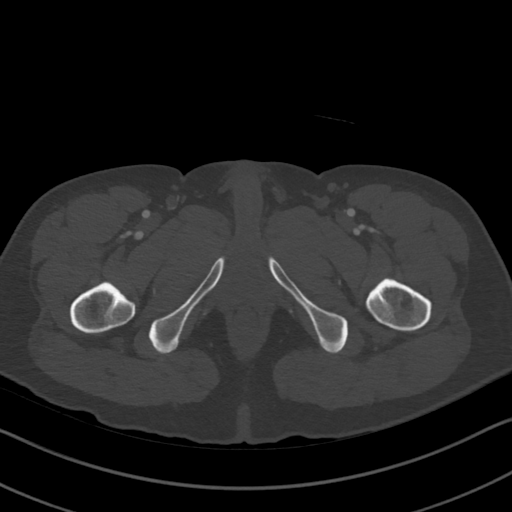
[im 55/307  soft-tissue]
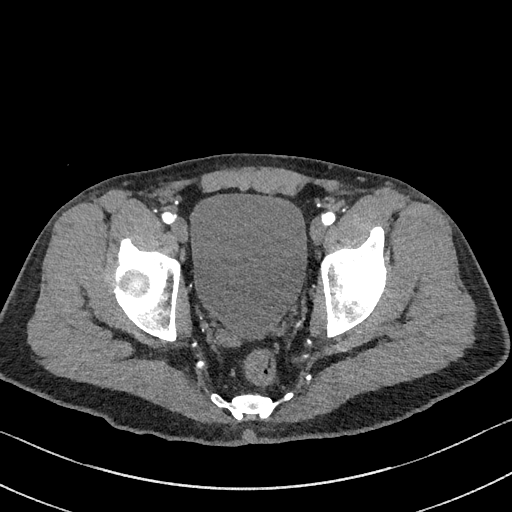
[im 91/307  soft-tissue]
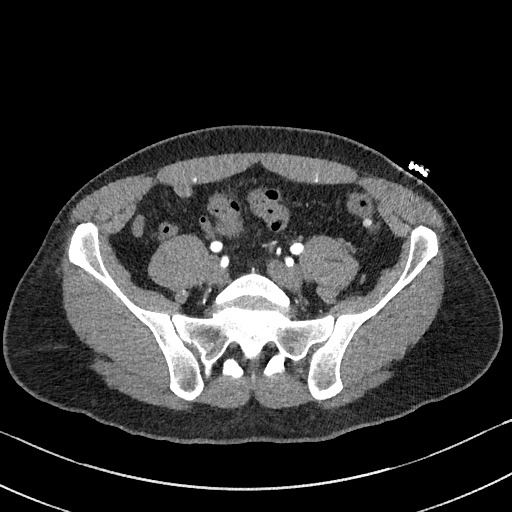
[im 109/307  soft-tissue]
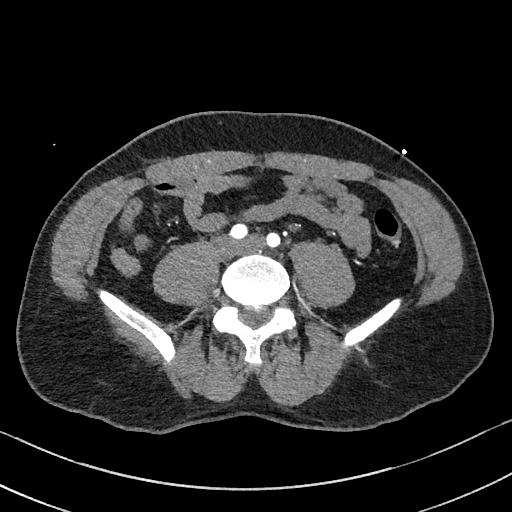
[im 145/307  soft-tissue]
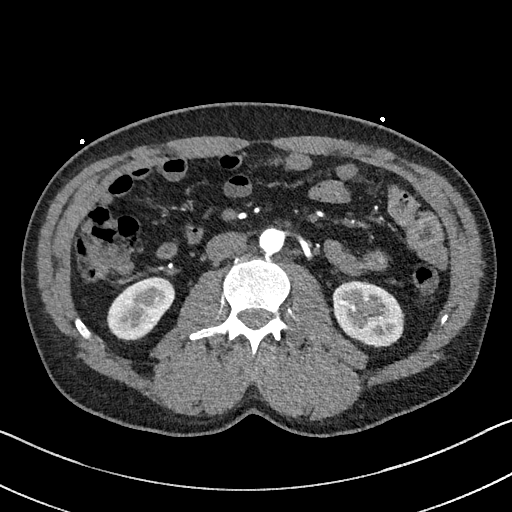
[im 163/307  soft-tissue]
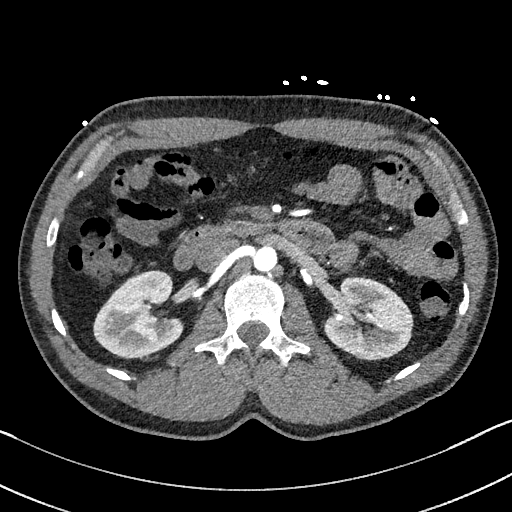
[im 199/307  soft-tissue]
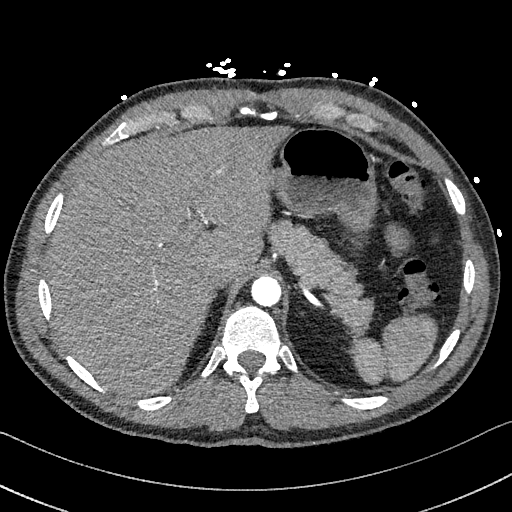
[im 235/307  soft-tissue]
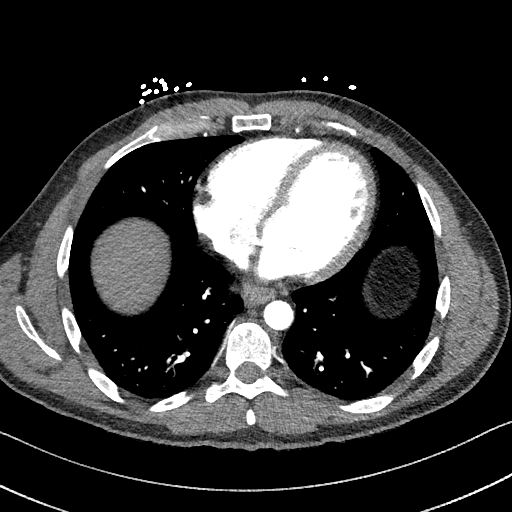
[im 253/307  soft-tissue]
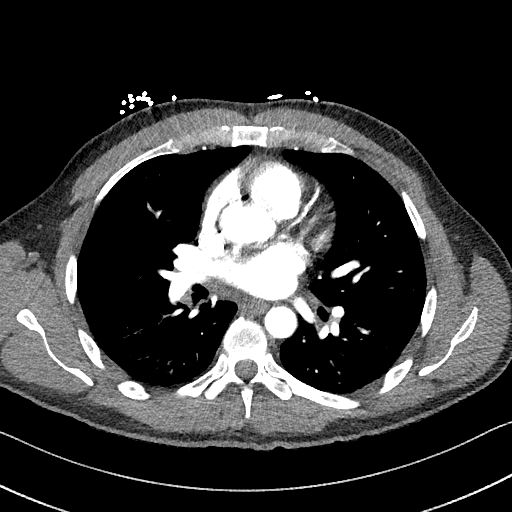
[im 253/307  bone]
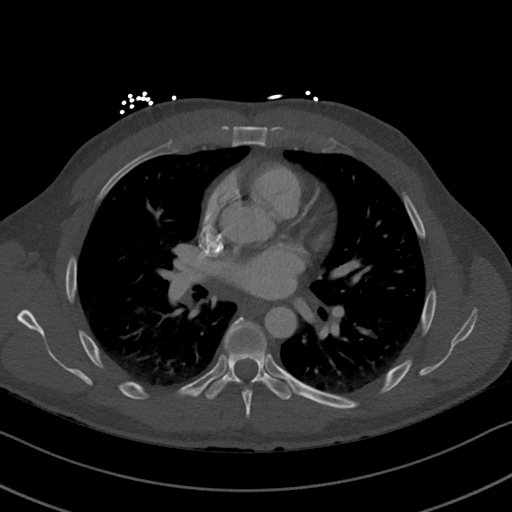
[im 289/307  soft-tissue]
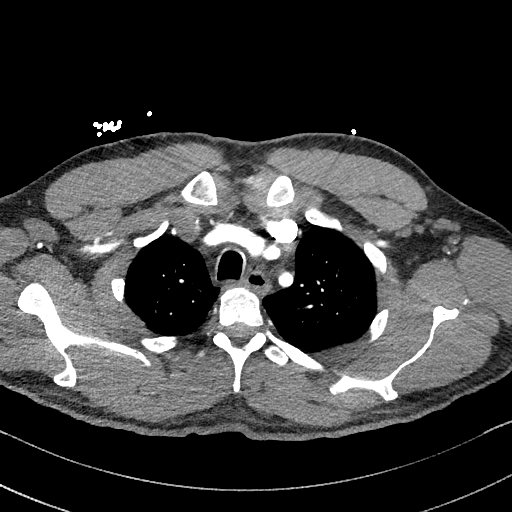

[Series 11: dissection 2mm cor · coronal · 0.72mm/px · 3 of 145 slices shown]
[im 37/145  soft-tissue]
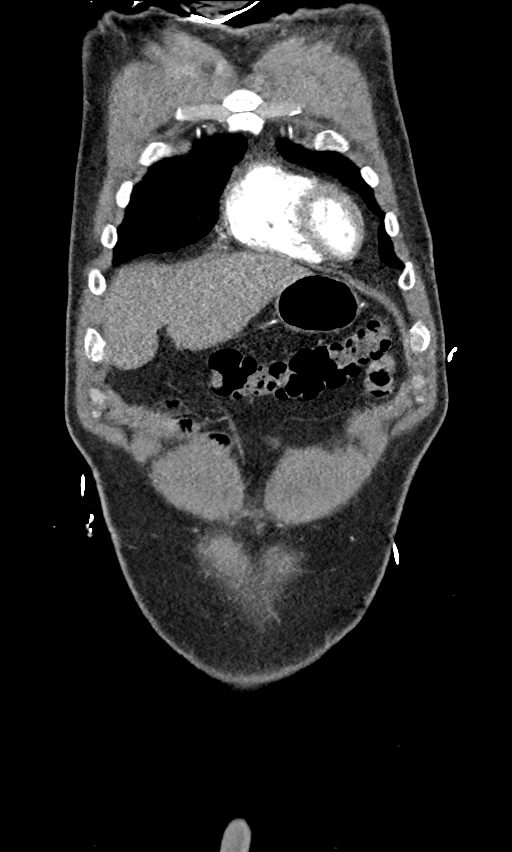
[im 73/145  soft-tissue]
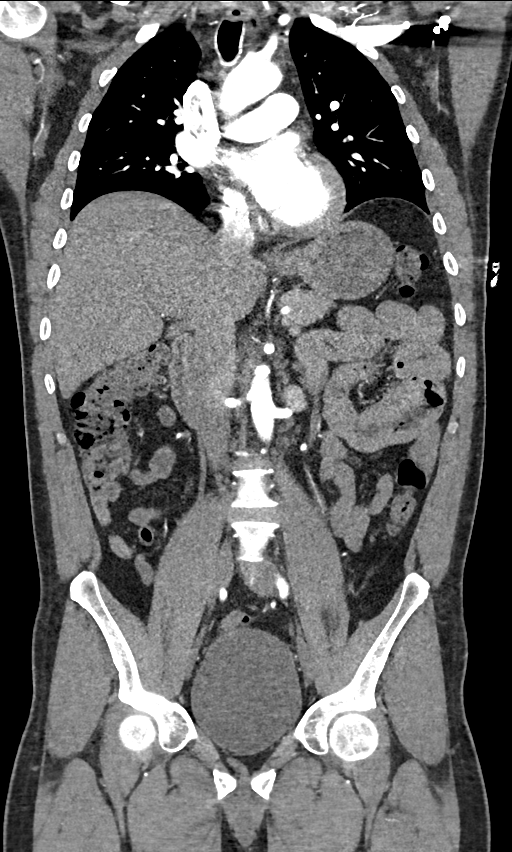
[im 109/145  soft-tissue]
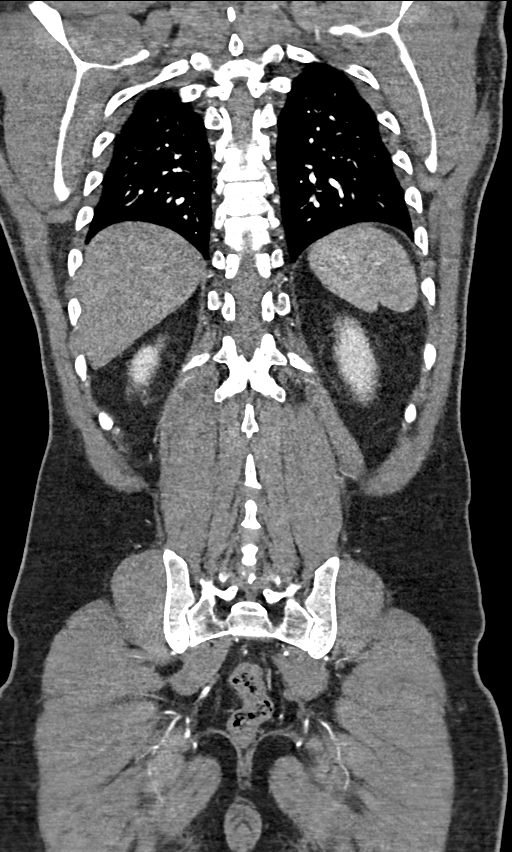

[13 of 46 positions shown; findings below may reference images not displayed]

Multidetector CT imaging through the chest, abdomen and pelvis was
performed using the standard protocol during bolus administration of
intravenous contrast. Multiplanar reconstructed images and MIPs were
obtained and reviewed to evaluate the vascular anatomy.

RADIATION DOSE REDUCTION: This exam was performed according to the
departmental dose-optimization program which includes automated
exposure control, adjustment of the mA and/or kV according to
patient size and/or use of iterative reconstruction technique.

CONTRAST:  100mL OMNIPAQUE IOHEXOL 350 MG/ML SOLN
FINDINGS: CTA CHEST FINDINGS

Cardiovascular: Preferential opacification of the thoracic aorta. No
evidence of thoracic aortic aneurysm or dissection. No intramural
hematoma. Normal heart size. No pericardial effusion. No pulmonary
embolism.

Mediastinum/Nodes: No enlarged mediastinal, hilar, or axillary lymph
nodes. Thyroid gland, trachea, and esophagus demonstrate no
significant findings.

Lungs/Pleura: Lungs are clear. No pleural effusion or pneumothorax.

Musculoskeletal: No chest wall abnormality. No acute or significant
osseous findings.

Review of the MIP images confirms the above findings.

CTA ABDOMEN AND PELVIS FINDINGS

VASCULAR

Aorta: Normal caliber aorta without aneurysm, dissection, vasculitis
or significant stenosis.

Celiac: Patent without evidence of aneurysm, dissection, vasculitis
or significant stenosis.

SMA: Patent without evidence of aneurysm, dissection, vasculitis or
significant stenosis.

Renals: Single right and two left renal arteries are patent without
evidence of aneurysm, dissection, vasculitis, fibromuscular
dysplasia or significant stenosis.

IMA: Patent without evidence of aneurysm, dissection, vasculitis or
significant stenosis.

Inflow: Patent without evidence of aneurysm, dissection, vasculitis
or significant stenosis.

Veins: No obvious venous abnormality within the limitations of this
arterial phase study.

Review of the MIP images confirms the above findings.

NON-VASCULAR

Hepatobiliary: No focal liver abnormality is seen. No gallstones,
gallbladder wall thickening, or biliary dilatation.

Pancreas: Unremarkable. No pancreatic ductal dilatation or
surrounding inflammatory changes.

Spleen: Normal in size without focal abnormality.

Adrenals/Urinary Tract: Adrenal glands are unremarkable. Kidneys are
normal, without renal calculi, solid lesion, or hydronephrosis.
cm simple cyst in the upper pole of the left kidney. No follow-up
imaging is recommended. Bladder is unremarkable.

Stomach/Bowel: Stomach is within normal limits. Appendix appears
normal. No evidence of bowel wall thickening, distention, or
inflammatory changes. Mild left-sided colonic diverticulosis.

Lymphatic: No enlarged abdominal or pelvic lymph nodes.

Reproductive: Prostate is unremarkable.

Other: No abdominal wall hernia or abnormality. No abdominopelvic
ascites. No pneumoperitoneum.

Musculoskeletal: No acute or significant osseous findings. Chronic
bilateral L5 pars defects with trace anterolisthesis at L5-S1.

Review of the MIP images confirms the above findings.
IMPRESSION: 1. No evidence of thoracoabdominal aortic aneurysm or dissection.
2. No acute abnormality in the chest, abdomen, or pelvis.

## 2024-07-15 ENCOUNTER — Emergency Department (HOSPITAL_COMMUNITY)
Admission: EM | Admit: 2024-07-15 | Discharge: 2024-07-15 | Disposition: A | Discharge status: Home / Self Care | Payer: MEDICAID | Attending: Emergency Medicine | Admitting: Emergency Medicine

## 2024-07-15 ENCOUNTER — Emergency Department (HOSPITAL_COMMUNITY): Payer: MEDICAID

## 2024-07-15 ENCOUNTER — Encounter (HOSPITAL_COMMUNITY): Payer: Self-pay

## 2024-07-15 DIAGNOSIS — M545 Low back pain, unspecified: Secondary | ICD-10-CM | POA: Diagnosis present

## 2024-07-15 DIAGNOSIS — R1031 Right lower quadrant pain: Secondary | ICD-10-CM | POA: Diagnosis not present

## 2024-07-15 LAB — CBC WITH DIFFERENTIAL/PLATELET
Abs Immature Granulocytes: 0.02 K/uL (ref 0.00–0.07)
Basophils Absolute: 0.1 K/uL (ref 0.0–0.1)
Basophils Relative: 1 %
Eosinophils Absolute: 0.3 K/uL (ref 0.0–0.5)
Eosinophils Relative: 4 %
HCT: 44.8 % (ref 39.0–52.0)
Hemoglobin: 14.9 g/dL (ref 13.0–17.0)
Immature Granulocytes: 0 %
Lymphocytes Relative: 29 %
Lymphs Abs: 2.2 K/uL (ref 0.7–4.0)
MCH: 31.9 pg (ref 26.0–34.0)
MCHC: 33.3 g/dL (ref 30.0–36.0)
MCV: 95.9 fL (ref 80.0–100.0)
Monocytes Absolute: 0.6 K/uL (ref 0.1–1.0)
Monocytes Relative: 8 %
Neutro Abs: 4.5 K/uL (ref 1.7–7.7)
Neutrophils Relative %: 58 %
Platelets: 287 K/uL (ref 150–400)
RBC: 4.67 MIL/uL (ref 4.22–5.81)
RDW: 13.4 % (ref 11.5–15.5)
WBC: 7.7 K/uL (ref 4.0–10.5)
nRBC: 0 % (ref 0.0–0.2)

## 2024-07-15 LAB — COMPREHENSIVE METABOLIC PANEL WITH GFR
ALT: 13 U/L (ref 0–44)
AST: 14 U/L — ABNORMAL LOW (ref 15–41)
Albumin: 3.9 g/dL (ref 3.5–5.0)
Alkaline Phosphatase: 49 U/L (ref 38–126)
Anion gap: 11 (ref 5–15)
BUN: 9 mg/dL (ref 6–20)
CO2: 28 mmol/L (ref 22–32)
Calcium: 9 mg/dL (ref 8.9–10.3)
Chloride: 102 mmol/L (ref 98–111)
Creatinine, Ser: 0.73 mg/dL (ref 0.61–1.24)
GFR, Estimated: >60 mL/min (ref >60)
Glucose, Bld: 87 mg/dL (ref 70–99)
Potassium: 4 mmol/L (ref 3.5–5.1)
Sodium: 141 mmol/L (ref 135–145)
Total Bilirubin: 0.8 mg/dL (ref 0.0–1.2)
Total Protein: 6.9 g/dL (ref 6.5–8.1)

## 2024-07-15 LAB — URINALYSIS, ROUTINE W REFLEX MICROSCOPIC
Bacteria, UA: NONE SEEN
Bilirubin Urine: NEGATIVE
Glucose, UA: NEGATIVE mg/dL
Hgb urine dipstick: NEGATIVE
Ketones, ur: NEGATIVE mg/dL
Nitrite: NEGATIVE
Protein, ur: NEGATIVE mg/dL
Specific Gravity, Urine: 1.008 (ref 1.005–1.030)
pH: 8 (ref 5.0–8.0)

## 2024-07-15 LAB — LIPASE, BLOOD: Lipase: 30 U/L (ref 11–51)

## 2024-07-15 MED ORDER — LIDOCAINE 5 % EX PTCH: 1.0000 | MEDICATED_PATCH | CUTANEOUS | 0 refills | Status: AC

## 2024-07-15 MED ORDER — ACETAMINOPHEN 325 MG PO TABS
650.0000 mg | ORAL_TABLET | Freq: Once | ORAL | Status: AC
  Administered 2024-07-15: 650 mg via ORAL
  Filled 2024-07-15: qty 2

## 2024-07-15 MED ORDER — IOHEXOL 300 MG/ML  SOLN
100.0000 mL | Freq: Once | INTRAMUSCULAR | Status: AC | PRN
  Administered 2024-07-15: 100 mL via INTRAVENOUS

## 2024-07-15 MED ORDER — METHOCARBAMOL 500 MG PO TABS: 500.0000 mg | ORAL_TABLET | Freq: Four times a day (QID) | ORAL | 0 refills | Status: AC | PRN

## 2024-07-15 MED ORDER — POLYETHYLENE GLYCOL 3350 17 G PO PACK: 17.0000 g | PACK | Freq: Every day | ORAL | 0 refills | Status: AC

## 2024-07-15 MED ORDER — METHOCARBAMOL 500 MG PO TABS
500.0000 mg | ORAL_TABLET | Freq: Once | ORAL | Status: AC
  Administered 2024-07-15: 500 mg via ORAL
  Filled 2024-07-15: qty 1

## 2024-07-15 MED ORDER — HYDROCODONE-ACETAMINOPHEN 5-325 MG PO TABS
1.0000 | ORAL_TABLET | Freq: Once | ORAL | Status: AC
  Administered 2024-07-15: 1 via ORAL
  Filled 2024-07-15: qty 1

## 2024-07-15 MED ORDER — KETOROLAC TROMETHAMINE 15 MG/ML IJ SOLN
15.0000 mg | Freq: Once | INTRAMUSCULAR | Status: AC
  Administered 2024-07-15: 15 mg via INTRAVENOUS
  Filled 2024-07-15: qty 1

## 2024-07-15 MED ORDER — MELOXICAM 7.5 MG PO TABS: 7.5000 mg | ORAL_TABLET | Freq: Every day | ORAL | 0 refills | Status: AC

## 2024-07-15 NOTE — Discharge Instructions (Addendum)
 Is a pleasure taking care of you today.  You are seen in the ER for low back pain and right lower quadrant abdominal pain.  Your blood work was all reassuring, your CT scan shows some bladder thickening but your urinalysis does not show significant signs of UTI.  I sent your urine for culture.  If this comes back positive for infection he will get a phone call.  Please come back to the ER if you have new or worsening symptoms, slightly fever, worsening pain, vomiting, or other worrisome changes.  CT scan can be falsely reassuring in the setting of early appendicitis or other early processes, is important to be reevaluated if you feel worse.  Your CT scan also showed constipation which can cause pain as well so start taking the MiraLAX daily.  Be sure to drink plenty of water.  Eat foods high in fiber.

## 2024-07-15 NOTE — ED Triage Notes (Signed)
 Pt comes in for lower back by EMS. 8/10 for pain. Pt did come from home. Pt denies any injury. Pt did not take any thing for pain.    EMS VS 124/76 BP  80 HR 99% RA

## 2024-07-15 NOTE — ED Provider Notes (Signed)
 [place] EMERGENCY DEPARTMENT AT [facility] Provider Note   CSN: [id number] Arrival date & time: 07/15/24  2000     Patient presents with: Back Pain   Roger Peterson is a 48 y.o. male.  This ER for evaluation of lower back pain and right lower quadrant abdominal pain.  He states he has low back pain for couple of days which is not abnormal for him but today he is having right lower quadrant abdominal pain, states it hurts to move or walk.  Denies nausea or vomiting, no fever or chills.  He does admit to urinary frequency but denies dysuria or hematuria.  No history of kidney stones, he has never had pain like this in the past.    Back Pain      Prior to Admission medications   Medication Sig Start Date End Date Taking? Authorizing Provider  lidocaine  (LIDODERM ) 5 % Place 1 patch onto the skin daily. Remove & Discard patch within 12 hours or as directed by MD 07/15/24  Yes Suellen, Dakayla Disanti A, PA-C  meloxicam  (MOBIC ) 7.5 MG tablet Take 1 tablet (7.5 mg total) by mouth daily. 07/15/24  Yes Cyrus Ramsburg A, PA-C  methocarbamol  (ROBAXIN ) 500 MG tablet Take 1 tablet (500 mg total) by mouth every 6 (six) hours as needed for muscle spasms. 07/15/24  Yes Kamaron Deskins A, PA-C  polyethylene glycol (MIRALAX ) 17 g packet Take 17 g by mouth daily. 07/15/24  Yes Lynette Topete A, PA-C  hydrOXYzine  (ATARAX ) 10 MG tablet Take 1 tablet (10 mg total) by mouth 3 (three) times daily as needed for anxiety. 07/23/22   Nwoko, Uchenna E, PA  QUEtiapine  (SEROQUEL ) 50 MG tablet Take 1 tablet (50 mg total) by mouth at bedtime. 07/23/22 07/23/23  Nwoko, Uchenna E, PA    Allergies: Patient has no known allergies.    Review of Systems  Musculoskeletal:  Positive for back pain.    Updated Vital Signs BP 119/79   Pulse 79   Temp 98.1 F (36.7 C) (Oral)   Resp 16   Ht 5' 8 (1.727 m)   Wt 77.1 kg   SpO2 98%   BMI 25.84 kg/m   Physical Exam Vitals and nursing note reviewed.  Constitutional:       General: He is not in acute distress.    Appearance: He is well-developed.  HENT:     Head: Normocephalic and atraumatic.     Mouth/Throat:     Mouth: Mucous membranes are moist.  Eyes:     Extraocular Movements: Extraocular movements intact.     Conjunctiva/sclera: Conjunctivae normal.     Pupils: Pupils are equal, round, and reactive to light.  Cardiovascular:     Rate and Rhythm: Normal rate and regular rhythm.     Heart sounds: No murmur heard. Pulmonary:     Effort: Pulmonary effort is normal. No respiratory distress.     Breath sounds: Normal breath sounds.  Abdominal:     Palpations: Abdomen is soft.     Tenderness: There is no abdominal tenderness.  Musculoskeletal:        General: No swelling.     Cervical back: Neck supple.  Skin:    General: Skin is warm and dry.     Capillary Refill: Capillary refill takes less than 2 seconds.  Neurological:     General: No focal deficit present.     Mental Status: He is alert and oriented to person, place, and time.  Psychiatric:  Mood and Affect: Mood normal.     (all labs ordered are listed, but only abnormal results are displayed) Labs Reviewed  COMPREHENSIVE METABOLIC PANEL WITH GFR - Abnormal; Notable for the following components:      Result Value   AST 14 (*)    All other components within normal limits  URINALYSIS, ROUTINE W REFLEX MICROSCOPIC - Abnormal; Notable for the following components:   Leukocytes,Ua SMALL (*)    All other components within normal limits  URINE CULTURE  CBC WITH DIFFERENTIAL/PLATELET  LIPASE, BLOOD    EKG: None  Radiology: CT ABDOMEN PELVIS W CONTRAST Result Date: 07/15/2024 CLINICAL DATA:  Right lower quadrant abdominal pain.  No injury. EXAM: CT ABDOMEN AND PELVIS WITH CONTRAST TECHNIQUE: Multidetector CT imaging of the abdomen and pelvis was performed using the standard protocol following bolus administration of intravenous contrast. RADIATION DOSE REDUCTION: This exam was  performed according to the departmental dose-optimization program which includes automated exposure control, adjustment of the mA and/or kV according to patient size and/or use of iterative reconstruction technique. CONTRAST:  OMNIPAQUE  IOHEXOL  300 MG/ML  SOLN COMPARISON:  05/13/2024 FINDINGS: Lower chest: Dependent atelectasis in the lung bases. Hepatobiliary: Diffuse fatty infiltration of the liver. No focal lesions. Gallbladder and bile ducts are normal. Pancreas: Unremarkable. No pancreatic ductal dilatation or surrounding inflammatory changes. Spleen: Normal in size without focal abnormality. Adrenals/Urinary Tract: No adrenal gland nodules. Kidneys are symmetrical. Nephrograms are homogeneous. No hydronephrosis or hydroureter. Bladder wall is diffusely thickened. This may indicate cystitis. Correlate with urinalysis. Stomach/Bowel: Stomach, small bowel, and colon are not abnormally distended. Stool diffusely throughout the colon suggesting constipation. No wall thickening or inflammatory stranding identified. Appendix is not visualized. Vascular/Lymphatic: Aortic atherosclerosis. No enlarged abdominal or pelvic lymph nodes. Reproductive: Prostate is unremarkable. Other: No abdominal wall hernia or abnormality. No abdominopelvic ascites. Musculoskeletal: Spondylolysis with mild spondylolisthesis at L5-S1. No acute bony abnormalities. IMPRESSION: 1. Diffuse fatty infiltration of the liver. 2. Diffuse bladder wall thickening may indicate cystitis. Correlate with urinalysis. 3. No evidence of bowel obstruction or inflammation. Diffusely stool-filled colon may indicate constipation. 4. Mild aortic atherosclerosis. Electronically Signed   By: Elsie Gravely M.D.   On: 07/15/2024 21:26     Procedures   Medications Ordered in the ED  ketorolac  (TORADOL ) 15 MG/ML injection 15 mg (15 mg Intravenous Given 07/15/24 2046)  iohexol  (OMNIPAQUE ) 300 MG/ML solution 100 mL (100 mLs Intravenous Contrast Given  07/15/24 2112)  methocarbamol  (ROBAXIN ) tablet 500 mg (500 mg Oral Given 07/15/24 2203)  acetaminophen  (TYLENOL ) tablet 650 mg (650 mg Oral Given 07/15/24 2203)  HYDROcodone -acetaminophen  (NORCO/VICODIN) 5-325 MG per tablet 1 tablet (1 tablet Oral Given 07/15/24 2203)                                    Medical Decision Making Differential diagnosis includes but not limited to lumbar strain, radiculopathy, ureterolithiasis, UTI, appendicitis, colitis, diverticulitis, colitis  ED course: Patient here with low back pain and right lower quadrant abdominal pain.  He does have right lower quadrant abdominal tenderness and is having urinary frequency.  Will obtain labs, urine and CT abdomen pelvis.  Labs normal, UA shows small leukocytes but no red blood cells, no white blood cells, no bacteria CT abdomen pelvis shows bladder wall thickening and diffuse fatty liver and large stool burden.  Given patient has bladder wall thickening without dysuria and with a normal urinalysis aside from  small leukocytes-will send the urine for culture but no indication for antibiotics at this time.  Discussed with patient was agreeable.  Will give MiraLAX  for constipation.  Amount and/or Complexity of Data Reviewed Labs: ordered. Radiology: ordered and independent interpretation performed.    Details: Bladder wall thickening, large stool burden I agree with radiology read  Risk OTC drugs. Prescription drug management.        Final diagnoses:  Acute right-sided low back pain without sciatica  Right lower quadrant abdominal pain    ED Discharge Orders          Ordered    methocarbamol  (ROBAXIN ) 500 MG tablet  Every 6 hours PRN        07/15/24 2209    lidocaine  (LIDODERM ) 5 %  Every 24 hours        07/15/24 2209    meloxicam  (MOBIC ) 7.5 MG tablet  Daily        07/15/24 2209    polyethylene glycol (MIRALAX ) 17 g packet  Daily        07/15/24 2211               Suellen Sherran DELENA DEVONNA 07/15/24  2214    Towana Ozell BROCKS, MD 07/16/24 1049

## 2024-07-17 LAB — URINE CULTURE: Culture: NO GROWTH
